# Patient Record
Sex: Female | Born: 1960 | Race: White | Hispanic: No | Marital: Married | Smoking: Never smoker
Health system: Southern US, Community
[De-identification: ages and names within clinical notes are randomized; demographics above are authoritative.]

## PROBLEM LIST (undated history)

## (undated) DIAGNOSIS — E039 Hypothyroidism, unspecified: Secondary | ICD-10-CM

## (undated) DIAGNOSIS — E079 Disorder of thyroid, unspecified: Secondary | ICD-10-CM

## (undated) DIAGNOSIS — I7300 Raynaud's syndrome without gangrene: Secondary | ICD-10-CM

## (undated) DIAGNOSIS — M329 Systemic lupus erythematosus, unspecified: Secondary | ICD-10-CM

## (undated) DIAGNOSIS — E119 Type 2 diabetes mellitus without complications: Secondary | ICD-10-CM

## (undated) DIAGNOSIS — M1990 Unspecified osteoarthritis, unspecified site: Secondary | ICD-10-CM

## (undated) DIAGNOSIS — M797 Fibromyalgia: Secondary | ICD-10-CM

## (undated) DIAGNOSIS — K769 Liver disease, unspecified: Secondary | ICD-10-CM

## (undated) DIAGNOSIS — M3500 Sicca syndrome, unspecified: Secondary | ICD-10-CM

## (undated) DIAGNOSIS — N3010 Interstitial cystitis (chronic) without hematuria: Secondary | ICD-10-CM

## (undated) DIAGNOSIS — M8700 Idiopathic aseptic necrosis of unspecified bone: Secondary | ICD-10-CM

## (undated) HISTORY — DX: Liver disease, unspecified: K76.9

## (undated) HISTORY — PX: NOSE SURGERY: SHX723

## (undated) HISTORY — PX: BREAST SURGERY: SHX581

## (undated) HISTORY — DX: Interstitial cystitis (chronic) without hematuria: N30.10

## (undated) HISTORY — DX: Raynaud's syndrome without gangrene: I73.00

## (undated) HISTORY — DX: Idiopathic aseptic necrosis of unspecified bone: M87.00

## (undated) HISTORY — PX: BILATERAL OOPHORECTOMY: SHX1221

## (undated) HISTORY — DX: Unspecified osteoarthritis, unspecified site: M19.90

## (undated) HISTORY — PX: ABDOMINAL HYSTERECTOMY: SHX81

## (undated) HISTORY — PX: OTHER SURGICAL HISTORY: SHX169

## (undated) HISTORY — DX: Hypothyroidism, unspecified: E03.9

## (undated) HISTORY — DX: Type 2 diabetes mellitus without complications: E11.9

## (undated) HISTORY — DX: Sjogren syndrome, unspecified: M35.00

---
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
## 1993-06-19 DIAGNOSIS — I7300 Raynaud's syndrome without gangrene: Secondary | ICD-10-CM | POA: Insufficient documentation

## 1993-06-19 HISTORY — DX: Raynaud's syndrome without gangrene: I73.00

## 1999-06-20 DIAGNOSIS — N3010 Interstitial cystitis (chronic) without hematuria: Secondary | ICD-10-CM | POA: Insufficient documentation

## 2002-04-10 ENCOUNTER — Encounter: Payer: Self-pay | Admitting: Family Medicine

## 2002-04-10 ENCOUNTER — Encounter: Admission: RE | Admit: 2002-04-10 | Discharge: 2002-04-10 | Payer: Self-pay | Admitting: Family Medicine

## 2004-03-19 ENCOUNTER — Emergency Department (HOSPITAL_COMMUNITY): Admission: EM | Admit: 2004-03-19 | Discharge: 2004-03-19 | Payer: Self-pay | Admitting: Emergency Medicine

## 2010-06-19 DIAGNOSIS — F419 Anxiety disorder, unspecified: Secondary | ICD-10-CM | POA: Insufficient documentation

## 2010-06-19 HISTORY — DX: Anxiety disorder, unspecified: F41.9

## 2011-08-21 DIAGNOSIS — G8190 Hemiplegia, unspecified affecting unspecified side: Secondary | ICD-10-CM

## 2011-08-21 DIAGNOSIS — J45909 Unspecified asthma, uncomplicated: Secondary | ICD-10-CM | POA: Insufficient documentation

## 2011-08-21 HISTORY — DX: Hemiplegia, unspecified affecting unspecified side: G81.90

## 2011-08-21 HISTORY — DX: Unspecified asthma, uncomplicated: J45.909

## 2012-11-23 DIAGNOSIS — G4700 Insomnia, unspecified: Secondary | ICD-10-CM

## 2012-11-23 DIAGNOSIS — M329 Systemic lupus erythematosus, unspecified: Secondary | ICD-10-CM | POA: Insufficient documentation

## 2012-11-23 DIAGNOSIS — E1142 Type 2 diabetes mellitus with diabetic polyneuropathy: Secondary | ICD-10-CM

## 2012-11-23 DIAGNOSIS — E039 Hypothyroidism, unspecified: Secondary | ICD-10-CM | POA: Insufficient documentation

## 2012-11-23 DIAGNOSIS — M2170 Unequal limb length (acquired), unspecified site: Secondary | ICD-10-CM | POA: Insufficient documentation

## 2012-11-23 DIAGNOSIS — Z794 Long term (current) use of insulin: Secondary | ICD-10-CM | POA: Insufficient documentation

## 2012-11-23 DIAGNOSIS — K769 Liver disease, unspecified: Secondary | ICD-10-CM

## 2012-11-23 HISTORY — DX: Systemic lupus erythematosus, unspecified: M32.9

## 2012-11-23 HISTORY — DX: Type 2 diabetes mellitus with diabetic polyneuropathy: E11.42

## 2012-11-23 HISTORY — DX: Hypothyroidism, unspecified: E03.9

## 2012-11-23 HISTORY — DX: Unequal limb length (acquired), unspecified site: M21.70

## 2012-11-23 HISTORY — DX: Liver disease, unspecified: K76.9

## 2012-11-23 HISTORY — DX: Insomnia, unspecified: G47.00

## 2013-03-19 DIAGNOSIS — M542 Cervicalgia: Secondary | ICD-10-CM

## 2013-03-19 HISTORY — DX: Cervicalgia: M54.2

## 2013-08-11 DIAGNOSIS — K5900 Constipation, unspecified: Secondary | ICD-10-CM

## 2013-08-11 HISTORY — DX: Constipation, unspecified: K59.00

## 2015-04-13 ENCOUNTER — Encounter (HOSPITAL_COMMUNITY): Payer: Self-pay | Admitting: *Deleted

## 2015-04-13 ENCOUNTER — Emergency Department (HOSPITAL_COMMUNITY): Payer: Medicare Other

## 2015-04-13 ENCOUNTER — Emergency Department (HOSPITAL_COMMUNITY)
Admission: EM | Admit: 2015-04-13 | Discharge: 2015-04-14 | Disposition: A | Discharge status: Home / Self Care | Payer: Medicare Other | Attending: Emergency Medicine | Admitting: Emergency Medicine

## 2015-04-13 DIAGNOSIS — Y9289 Other specified places as the place of occurrence of the external cause: Secondary | ICD-10-CM | POA: Insufficient documentation

## 2015-04-13 DIAGNOSIS — Z79899 Other long term (current) drug therapy: Secondary | ICD-10-CM | POA: Insufficient documentation

## 2015-04-13 DIAGNOSIS — W06XXXA Fall from bed, initial encounter: Secondary | ICD-10-CM | POA: Diagnosis not present

## 2015-04-13 DIAGNOSIS — Y9389 Activity, other specified: Secondary | ICD-10-CM | POA: Diagnosis not present

## 2015-04-13 DIAGNOSIS — R451 Restlessness and agitation: Secondary | ICD-10-CM | POA: Insufficient documentation

## 2015-04-13 DIAGNOSIS — Z880 Allergy status to penicillin: Secondary | ICD-10-CM | POA: Diagnosis not present

## 2015-04-13 DIAGNOSIS — S060X9A Concussion with loss of consciousness of unspecified duration, initial encounter: Secondary | ICD-10-CM | POA: Insufficient documentation

## 2015-04-13 DIAGNOSIS — Y998 Other external cause status: Secondary | ICD-10-CM | POA: Diagnosis not present

## 2015-04-13 DIAGNOSIS — Z8739 Personal history of other diseases of the musculoskeletal system and connective tissue: Secondary | ICD-10-CM | POA: Diagnosis not present

## 2015-04-13 DIAGNOSIS — F419 Anxiety disorder, unspecified: Secondary | ICD-10-CM | POA: Diagnosis present

## 2015-04-13 DIAGNOSIS — F410 Panic disorder [episodic paroxysmal anxiety] without agoraphobia: Secondary | ICD-10-CM | POA: Diagnosis not present

## 2015-04-13 DIAGNOSIS — E079 Disorder of thyroid, unspecified: Secondary | ICD-10-CM | POA: Insufficient documentation

## 2015-04-13 HISTORY — DX: Disorder of thyroid, unspecified: E07.9

## 2015-04-13 HISTORY — DX: Fibromyalgia: M79.7

## 2015-04-13 HISTORY — DX: Systemic lupus erythematosus, unspecified: M32.9

## 2015-04-13 MED ORDER — LORAZEPAM 1 MG PO TABS: 1.0000 mg | ORAL_TABLET | Freq: Three times a day (TID) | ORAL | Status: DC | PRN

## 2015-04-13 MED ORDER — LORAZEPAM 1 MG PO TABS
1.0000 mg | ORAL_TABLET | Freq: Once | ORAL | Status: AC
  Administered 2015-04-13: 1 mg via ORAL
  Filled 2015-04-13: qty 1

## 2015-04-13 MED ORDER — HYDROCODONE-ACETAMINOPHEN 5-325 MG PO TABS
1.0000 | ORAL_TABLET | Freq: Once | ORAL | Status: AC
Start: 2015-04-14 — End: 2015-04-13
  Administered 2015-04-13: 1 via ORAL
  Filled 2015-04-13: qty 1
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
## 2015-04-13 NOTE — ED Notes (Signed)
Patient sitting on stretcher, respirations even and unlabored, skin warm and dry, in NAD, denies further needs, will continue to monitor.
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
## 2015-04-13 NOTE — Discharge Instructions (Signed)
Concussion, Adult A concussion, or closed-head injury, is a brain injury caused by a direct blow to the head or by a quick and sudden movement (jolt) of the head or neck. Concussions are usually not life-threatening. Even so, the effects of a concussion can be serious. If you have had a concussion before, you are more likely to experience concussion-like symptoms after a direct blow to the head.  CAUSES  Direct blow to the head, such as from running into another player during a soccer game, being hit in a fight, or hitting your head on a hard surface.  A jolt of the head or neck that causes the brain to move back and forth inside the skull, such as in a car crash. SIGNS AND SYMPTOMS The signs of a concussion can be hard to notice. Early on, they may be missed by you, family members, and health care providers. You may look fine but act or feel differently. Symptoms are usually temporary, but they may last for days, weeks, or even longer. Some symptoms may appear right away while others may not show up for hours or days. Every head injury is different. Symptoms include:  Mild to moderate headaches that will not go away.  A feeling of pressure inside your head.  Having more trouble than usual:  Learning or remembering things you have heard.  Answering questions.  Paying attention or concentrating.  Organizing daily tasks.  Making decisions and solving problems.  Slowness in thinking, acting or reacting, speaking, or reading.  Getting lost or being easily confused.  Feeling tired all the time or lacking energy (fatigued).  Feeling drowsy.  Sleep disturbances.  Sleeping more than usual.  Sleeping less than usual.  Trouble falling asleep.  Trouble sleeping (insomnia).  Loss of balance or feeling lightheaded or dizzy.  Nausea or vomiting.  Numbness or tingling.  Increased sensitivity to:  Sounds.  Lights.  Distractions.  Vision problems or eyes that tire  easily.  Diminished sense of taste or smell.  Ringing in the ears.  Mood changes such as feeling sad or anxious.  Becoming easily irritated or angry for little or no reason.  Lack of motivation.  Seeing or hearing things other people do not see or hear (hallucinations). DIAGNOSIS Your health care provider can usually diagnose a concussion based on a description of your injury and symptoms. He or she will ask whether you passed out (lost consciousness) and whether you are having trouble remembering events that happened right before and during your injury. Your evaluation might include:  A brain scan to look for signs of injury to the brain. Even if the test shows no injury, you may still have a concussion.  Blood tests to be sure other problems are not present. TREATMENT  Concussions are usually treated in an emergency department, in urgent care, or at a clinic. You may need to stay in the hospital overnight for further treatment.  Tell your health care provider if you are taking any medicines, including prescription medicines, over-the-counter medicines, and natural remedies. Some medicines, such as blood thinners (anticoagulants) and aspirin, may increase the chance of complications. Also tell your health care provider whether you have had alcohol or are taking illegal drugs. This information may affect treatment.  Your health care provider will send you home with important instructions to follow.  How fast you will recover from a concussion depends on many factors. These factors include how severe your concussion is, what part of your brain was injured,  your age, and how healthy you were before the concussion.  Most people with mild injuries recover fully. Recovery can take time. In general, recovery is slower in older persons. Also, persons who have had a concussion in the past or have other medical problems may find that it takes longer to recover from their current injury. HOME  CARE INSTRUCTIONS General Instructions  Carefully follow the directions your health care provider gave you.  Only take over-the-counter or prescription medicines for pain, discomfort, or fever as directed by your health care provider.  Take only those medicines that your health care provider has approved.  Do not drink alcohol until your health care provider says you are well enough to do so. Alcohol and certain other drugs may slow your recovery and can put you at risk of further injury.  If it is harder than usual to remember things, write them down.  If you are easily distracted, try to do one thing at a time. For example, do not try to watch TV while fixing dinner.  Talk with family members or close friends when making important decisions.  Keep all follow-up appointments. Repeated evaluation of your symptoms is recommended for your recovery.  Watch your symptoms and tell others to do the same. Complications sometimes occur after a concussion. Older adults with a brain injury may have a higher risk of serious complications, such as a blood clot on the brain.  Tell your teachers, school nurse, school counselor, coach, athletic trainer, or work Freight forwarder about your injury, symptoms, and restrictions. Tell them about what you can or cannot do. They should watch for:  Increased problems with attention or concentration.  Increased difficulty remembering or learning new information.  Increased time needed to complete tasks or assignments.  Increased irritability or decreased ability to cope with stress.  Increased symptoms.  Rest. Rest helps the brain to heal. Make sure you:  Get plenty of sleep at night. Avoid staying up late at night.  Keep the same bedtime hours on weekends and weekdays.  Rest during the day. Take daytime naps or rest breaks when you feel tired.  Limit activities that require a lot of thought or concentration. These include:  Doing homework or job-related  work.  Watching TV.  Working on the computer.  Avoid any situation where there is potential for another head injury (football, hockey, soccer, basketball, martial arts, downhill snow sports and horseback riding). Your condition will get worse every time you experience a concussion. You should avoid these activities until you are evaluated by the appropriate follow-up health care providers. Returning To Your Regular Activities You will need to return to your normal activities slowly, not all at once. You must give your body and brain enough time for recovery.  Do not return to sports or other athletic activities until your health care provider tells you it is safe to do so.  Ask your health care provider when you can drive, ride a bicycle, or operate heavy machinery. Your ability to react may be slower after a brain injury. Never do these activities if you are dizzy.  Ask your health care provider about when you can return to work or school. Preventing Another Concussion It is very important to avoid another brain injury, especially before you have recovered. In rare cases, another injury can lead to permanent brain damage, brain swelling, or death. The risk of this is greatest during the first 7-10 days after a head injury. Avoid injuries by:  Wearing a  seat belt when riding in a car.  Drinking alcohol only in moderation.  Wearing a helmet when biking, skiing, skateboarding, skating, or doing similar activities.  Avoiding activities that could lead to a second concussion, such as contact or recreational sports, until your health care provider says it is okay.  Taking safety measures in your home.  Remove clutter and tripping hazards from floors and stairways.  Use grab bars in bathrooms and handrails by stairs.  Place non-slip mats on floors and in bathtubs.  Improve lighting in dim areas. SEEK MEDICAL CARE IF:  You have increased problems paying attention or  concentrating.  You have increased difficulty remembering or learning new information.  You need more time to complete tasks or assignments than before.  You have increased irritability or decreased ability to cope with stress.  You have more symptoms than before. Seek medical care if you have any of the following symptoms for more than 2 weeks after your injury:  Lasting (chronic) headaches.  Dizziness or balance problems.  Nausea.  Vision problems.  Increased sensitivity to noise or light.  Depression or mood swings.  Anxiety or irritability.  Memory problems.  Difficulty concentrating or paying attention.  Sleep problems.  Feeling tired all the time. SEEK IMMEDIATE MEDICAL CARE IF:  You have severe or worsening headaches. These may be a sign of a blood clot in the brain.  You have weakness (even if only in one hand, leg, or part of the face).  You have numbness.  You have decreased coordination.  You vomit repeatedly.  You have increased sleepiness.  One pupil is larger than the other.  You have convulsions.  You have slurred speech.  You have increased confusion. This may be a sign of a blood clot in the brain.  You have increased restlessness, agitation, or irritability.  You are unable to recognize people or places.  You have neck pain.  It is difficult to wake you up.  You have unusual behavior changes.  You lose consciousness. MAKE SURE YOU:  Understand these instructions.  Will watch your condition.  Will get help right away if you are not doing well or get worse.   This information is not intended to replace advice given to you by your health care provider. Make sure you discuss any questions you have with your health care provider.   Document Released: 08/26/2003 Document Revised: 06/26/2014 Document Reviewed: 12/26/2012 Elsevier Interactive Patient Education 2016 Elsevier Inc.  Panic Attacks Panic attacks are sudden,  short-livedsurges of severe anxiety, fear, or discomfort. They may occur for no reason when you are relaxed, when you are anxious, or when you are sleeping. Panic attacks may occur for a number of reasons:   Healthy people occasionally have panic attacks in extreme, life-threatening situations, such as war or natural disasters. Normal anxiety is a protective mechanism of the body that helps [place] react to danger (fight or flight response).  Panic attacks are often seen with anxiety disorders, such as panic disorder, social anxiety disorder, generalized anxiety disorder, and phobias. Anxiety disorders cause excessive or uncontrollable anxiety. They may interfere with your relationships or other life activities.  Panic attacks are sometimes seen with other mental illnesses, such as depression and posttraumatic stress disorder.  Certain medical conditions, prescription medicines, and drugs of abuse can cause panic attacks. SYMPTOMS  Panic attacks start suddenly, peak within 20 minutes, and are accompanied by four or more of the following symptoms:  Pounding heart or fast heart rate (  palpitations).  Sweating.  Trembling or shaking.  Shortness of breath or feeling smothered.  Feeling choked.  Chest pain or discomfort.  Nausea or strange feeling in your stomach.  Dizziness, light-headedness, or feeling like you will faint.  Chills or hot flushes.  Numbness or tingling in your lips or hands and feet.  Feeling that things are not real or feeling that you are not yourself.  Fear of losing control or going crazy.  Fear of dying. Some of these symptoms can mimic serious medical conditions. For example, you may think you are having a heart attack. Although panic attacks can be very scary, they are not life threatening. DIAGNOSIS  Panic attacks are diagnosed through an assessment by your health care provider. Your health care provider will ask questions about your symptoms, such as where and  when they occurred. Your health care provider will also ask about your medical history and use of alcohol and drugs, including prescription medicines. Your health care provider may order blood tests or other studies to rule out a serious medical condition. Your health care provider may refer you to a mental health professional for further evaluation. TREATMENT   Most healthy people who have one or two panic attacks in an extreme, life-threatening situation will not require treatment.  The treatment for panic attacks associated with anxiety disorders or other mental illness typically involves counseling with a mental health professional, medicine, or a combination of both. Your health care provider will help determine what treatment is best for you.  Panic attacks due to physical illness usually go away with treatment of the illness. If prescription medicine is causing panic attacks, talk with your health care provider about stopping the medicine, decreasing the dose, or substituting another medicine.  Panic attacks due to alcohol or drug abuse go away with abstinence. Some adults need professional help in order to stop drinking or using drugs. HOME CARE INSTRUCTIONS   Take all medicines as directed by your health care provider.   Schedule and attend follow-up visits as directed by your health care provider. It is important to keep all your appointments. SEEK MEDICAL CARE IF:  You are not able to take your medicines as prescribed.  Your symptoms do not improve or get worse. SEEK IMMEDIATE MEDICAL CARE IF:   You experience panic attack symptoms that are different than your usual symptoms.  You have serious thoughts about hurting yourself or others.  You are taking medicine for panic attacks and have a serious side effect. MAKE SURE YOU:  Understand these instructions.  Will watch your condition.  Will get help right away if you are not doing well or get worse.   This information is  not intended to replace advice given to you by your health care provider. Make sure you discuss any questions you have with your health care provider.   Document Released: 06/05/2005 Document Revised: 06/10/2013 Document Reviewed: 01/17/2013 Elsevier Interactive Patient Education 2016 [employer].   Emergency Department Resource Guide 1) Find a Doctor and Pay Out of Pocket Although you won't have to find out who is covered by your insurance plan, it is a good idea to ask around and get recommendations. You will then need to call the office and see if the doctor you have chosen will accept you as a new patient and what types of options they offer for patients who are self-pay. Some doctors offer discounts or will set up payment plans for their patients who do not have insurance,  but you will need to ask so you aren't surprised when you get to your appointment.  2) Contact Your Local Health Department Not all health departments have doctors that can see patients for sick visits, but many do, so it is worth a call to see if yours does. If you don't know where your local health department is, you can check in your phone book. The CDC also has a tool to help you locate your state's health department, and many state websites also have listings of all of their local health departments.  3) Find a Walk-in Clinic If your illness is not likely to be very severe or complicated, you may want to try a walk in clinic. These are popping up all over the country in pharmacies, drugstores, and shopping centers. They're usually staffed by nurse practitioners or physician assistants that have been trained to treat common illnesses and complaints. They're usually fairly quick and inexpensive. However, if you have serious medical issues or chronic medical problems, these are probably not your best option.  No Primary Care Doctor: - Call Health Connect at  [phone] - they can help you locate a primary care doctor that   accepts your insurance, provides certain services, etc. - Physician Referral Service- [phone]  Chronic Pain Problems: Organization         Address  Phone   Notes  [facility]  [phone] Patients need to be referred by their primary care doctor.   Medication Assistance: Organization         Address  Phone   Notes  [facility] Medication [facility] [address] [place] [place], [place] [address] [phone] --Must be a resident of [facility] -- Must have NO insurance coverage whatsoever (no Medicaid/ Medicare, etc.) -- The pt. MUST have a primary care doctor that directs their care regularly and follows them in the community   [facility]  [phone]   [employer]  [phone]    Agencies that provide inexpensive medical care: Organization         Address  Phone   Notes  [facility]  [phone]   [facility]    [phone]   [facility] [address] [place], [place] [address] [phone]   [facility] [address], [place] [phone]   [facility]    [phone]   [facility]    [phone]   [facility]  [address], [place] Phone:  [phone], Fax:  [phone] Hours of Operation:  9 am - 6 pm, M-F.  Also accepts Medicaid/Medicare and self-pay.  [facility]  [address], [place] Phone: [phone], Fax: [phone]. Hours of Operation:  8:30 am - 5:30 pm, M-F.  Also accepts Medicaid and self-pay.  [facility] [place] [address], [place] Phone: [phone]   [facility] [address] Nat[address]ence [place], [place] [phone], Ext. [phone] Mondays & Thursdays: 7-9 AM.  First 15 patients are seen on a first come, first serve basis.    Medicaid-accepting [facility] Providers:  Organization          Address  Phone   Notes  [facility] [address], [place] [phone] Also accepts self-pay patients.  [facility] [address] [place],  [address] [place]  [phone]   [facility] [address], [place] [phone]   [facility] [address], [place] [phone]   Renaye Rakers [address], [place]   [phone] Only accepts [place] patients after they have their name applied to their card.   Self-Pay (no insurance) in [facility]:  Organization         Address  Phone   Notes  Sickle Cell Patients, [facility] [address] [place], [place] [phone]   [facility] Urgent Care [address] [place], [place] [phone]   [facility] [place]  [address], [place] [phone]   [facility]/Dr. Osei-Bonsu  [address], [place] or [address], [place] [phone] Phone number for both [place] and [place] locations is the same.  [facility] [address] [place] [phone]   [facility] [address], [place] or [address] [phone]   [facility] [address], [place] [phone], phone; [phone], fax Sees patients 1st and 3rd Saturday of every month.  Must not qualify for public or private insurance (i.e. Medicaid, Medicare, [place] Health Choice, Veterans' Benefits)  Household income should be no more than 200% of the poverty level The clinic cannot treat you if you are pregnant or think you are pregnant  Sexually transmitted diseases are not treated at the clinic.    Dental Care: Organization         Address  Phone  Notes  [facility] Department of [facility] [address] [place],  [place] [phone] Accepts children up to age 23 who are enrolled in [place] or [place] Health Choice; pregnant women with a Medicaid card; and children who have applied for Medicaid or [place] Health Choice, but were declined, whose parents can pay a reduced fee at time of service.  [facility] Department of [facility]  [address], [place] [phone] Accepts children up to age 2 who are enrolled in [place] or [place] Health Choice; pregnant women with a Medicaid card; and children who have applied for Medicaid or [place] Health Choice, but were declined, whose parents can pay a reduced fee at time of service.  [facility]  [address] [place], [place] [phone] Patients are seen by appointment only. Walk-ins are not accepted. [facility] will see patients 39 years of age and older. Monday - Tuesday (8am-5pm) Most Wednesdays (8:30-5pm) $30 per visit, cash only  [facility]  [address], [facility] [phone] Patients are seen by appointment only. Walk-ins are not accepted. [facility] will see patients 58 years of age and older. One Wednesday Evening (Monthly: Volunteer Based).  $30 per visit, cash only  [employer]  [phone] for adults; Children under age 27, call Graduate Pediatric Dentistry at [phone]. Children aged 61-14, please call [phone] to request a pediatric application.  Dental services are provided in all areas of dental care including fillings, crowns and bridges, complete and partial dentures, implants, gum treatment, root canals, and extractions. Preventive care is also provided. Treatment is provided to both adults and children. Patients are selected via a lottery  and there is often a waiting list.   [facility] [address], [place]  [phone] [email or web address]   [facility] [address], [place], [place]  [phone], Ext. [phone] Second and Fourth Thursday of each month, opens at 6:30 AM; Clinic ends at 9 AM.  Patients are seen on a first-come first-served basis, and a limited number are seen during each clinic.   [facility]  [address], [place], [place] [phone]   Eligibility Requirements You must have lived in [place], [place], or [place] counties for at least the last three months.   You cannot be eligible for state or federal sponsored [place]healthcare insurance, including CIGNAVeterans Administration, [place], or [employer]icare.   You generally cannot be eligible for healthcare insurance through your employer.    How to apply: Eligibility screenings are held every Tuesday and Wednesday afternoon from 1:00 pm until 4:00 pm. You do not need an appointment for the interview!  [facility] [address], [place], [place] [phone]   [facility]  [phone]   [facility]  [phone]   [facility]  [phone]    Behavioral Health Resources in the Community: Intensive Outpatient Programs Organization         Address  Phone  Notes  [facility] [address], [place], [place] [phone]   [facility] [address], [place], [place] [phone]   [facility] [address], [place], [place]  [phone]   [facility] [address],  [place], [place] [phone] or [phone]   Substance Abuse Resources Organization         Address  Phone  Notes  [facility]  [phone]   [facility]  [phone]   [facility]  [phone]   [facility]  [phone]   Residential & Outpatient Substance Abuse Program  [phone]   Psychological Services Organization         Address  Phone  Notes  [facility]  [phone]   [facility]  [phone]- [phone]    [facility] [address], [place] [phone] or [phone]    Mobile Crisis Teams Organization         Address  Phone  Notes  [facility], Mobile Crisis Care Unit  [phone]   [facility]  [address] [place], [place] [phone]   Doristine LocksSharon DeEsch [address] [place] [phone]    Self-Help/Support Groups Organization         Address  Phone             Notes  [facility] [place] - variety of support groups  [phone]- [phone] Call for more information  Narcotics Anonymous (NA), [facility] [address], [place]  2 meetings at this location   [facility] Programs Organization         Address  Phone  Notes  [facility] [address],    [place]  [phone]   [facility]  [address], [place] [phone], [place], [place] [phone]   [facility] [address], [place] [phone] Admissions: 8am-3pm M-F  [facility] [address]    [place], [place] [phone]   [facility] [address], [place],  [place] [phone]   [facility] [address]  [place], [place] [phone]   [facility] - Intensive Outpatient [address], [place], [place] [phone]   [facility] ([facility] [address]  [place], [place] [phone] or [phone]   [facility] ([facility]) [address] [place], [place] [phone] Accepts Medicaid  [facility] [address]  [place] [phone] Substance Abuse/Addiction Treatment   [facility] Resources Organization         Address  Phone  Notes  [facility]  [phone]   Angie FavaJulie Brannon, PhD [address], [address] [place], [place]   [phone] or [phone]) [phone]   [facility]   [address] [place], [place] [phone]   [facility] [address], [place], [place] [phone]) [phone] Insurance/Medicaid/sponsorship through [facility]  [facility] [address]                                    [place], [place] [phone]) [phone] Therapy/tele-psych/case  [facility] [address]   [place], [place] [phone]    Dr. Lolly MustacheArfeen  [phone](336) [phone]   [facility]  [employer] [facility] 1) [address], [place] 2) [address], [place] 3)  [address], [place] [phone]) [phone]  [phone]) [phone]   [facility] Child Abuse Hotline [phone]) [phone] or [phone](336) [phone] (After Hours)

## 2015-04-14 DIAGNOSIS — F410 Panic disorder [episodic paroxysmal anxiety] without agoraphobia: Secondary | ICD-10-CM | POA: Diagnosis not present

## 2015-05-19 ENCOUNTER — Ambulatory Visit: Payer: Medicaid Other | Admitting: Internal Medicine

## 2015-05-19 DIAGNOSIS — Z0289 Encounter for other administrative examinations: Secondary | ICD-10-CM

## 2015-05-28 ENCOUNTER — Emergency Department (HOSPITAL_COMMUNITY)
Admission: EM | Admit: 2015-05-28 | Discharge: 2015-05-28 | Disposition: A | Discharge status: Home / Self Care | Payer: Medicare Other | Attending: Emergency Medicine | Admitting: Emergency Medicine

## 2015-05-28 ENCOUNTER — Encounter (HOSPITAL_COMMUNITY): Payer: Self-pay | Admitting: Emergency Medicine

## 2015-05-28 DIAGNOSIS — Z79899 Other long term (current) drug therapy: Secondary | ICD-10-CM | POA: Insufficient documentation

## 2015-05-28 DIAGNOSIS — M545 Low back pain: Secondary | ICD-10-CM | POA: Diagnosis present

## 2015-05-28 DIAGNOSIS — Z880 Allergy status to penicillin: Secondary | ICD-10-CM | POA: Diagnosis not present

## 2015-05-28 DIAGNOSIS — M797 Fibromyalgia: Secondary | ICD-10-CM | POA: Insufficient documentation

## 2015-05-28 DIAGNOSIS — E079 Disorder of thyroid, unspecified: Secondary | ICD-10-CM | POA: Diagnosis not present

## 2015-05-28 DIAGNOSIS — G8929 Other chronic pain: Secondary | ICD-10-CM | POA: Insufficient documentation

## 2015-05-28 DIAGNOSIS — M6281 Muscle weakness (generalized): Secondary | ICD-10-CM | POA: Diagnosis not present

## 2015-05-28 DIAGNOSIS — M549 Dorsalgia, unspecified: Secondary | ICD-10-CM | POA: Insufficient documentation

## 2015-05-28 LAB — URINE MICROSCOPIC-ADD ON

## 2015-05-28 LAB — URINALYSIS, ROUTINE W REFLEX MICROSCOPIC
BILIRUBIN URINE: NEGATIVE
Glucose, UA: >1000 mg/dL — AB
HGB URINE DIPSTICK: NEGATIVE
Ketones, ur: NEGATIVE mg/dL
NITRITE: NEGATIVE
PH: 5.5 (ref 5.0–8.0)
Protein, ur: NEGATIVE mg/dL
SPECIFIC GRAVITY, URINE: 1.015 (ref 1.005–1.030)

## 2015-05-28 LAB — CBG MONITORING, ED: GLUCOSE-CAPILLARY: 107 mg/dL — AB (ref 65–99)

## 2015-05-28 MED ORDER — HYDROCODONE-ACETAMINOPHEN 5-325 MG PO TABS
2.0000 | ORAL_TABLET | Freq: Once | ORAL | Status: AC
  Administered 2015-05-28: 2 via ORAL
  Filled 2015-05-28: qty 2

## 2015-05-28 MED ORDER — METHOCARBAMOL 500 MG PO TABS: 500.0000 mg | ORAL_TABLET | Freq: Two times a day (BID) | ORAL | Status: DC

## 2015-05-28 MED ORDER — METHOCARBAMOL 500 MG PO TABS
500.0000 mg | ORAL_TABLET | Freq: Once | ORAL | Status: AC
  Administered 2015-05-28: 500 mg via ORAL
  Filled 2015-05-28: qty 1

## 2015-05-28 NOTE — ED Notes (Signed)
Per patient: Pt states Tuesday and Wednesday she used fleets enema, having back pain. Went to PCP who consulted her pain clinic to try and find a better solution for her pain relief. Pt crying in triage. Pt states the back pain started Sunday while sitting on the couch, says the left side hurts and radiates into the right side. States she vomited this morning from the pain.

## 2015-05-28 NOTE — ED Provider Notes (Signed)
CSN: [id number]     Arrival date & time 05/28/15  1542 History   First MD Initiated Contact with Patient 05/28/15 1800     Chief Complaint  Patient presents with  . Flank Pain  . Back Pain    HPI   Jamie Holmes is a 54 y.o. female with a PMH of SLE, fibromyalgia, chronic pain, thyroid disease who presents to the ED with bilateral lower back pain, which she states started Sunday and has been constant since that time. She reports radiation of her back pain to her left lower extremity. She reports chronic right sided weakness, which she states is unchanged from prior. She denies exacerbating factors. She has tried her home pain medication for symptom relief, which has been minimally effective. She denies fever, chills, abdominal pain, N/V/D/C, dysuria, urgency, frequency, hematuria. She denies numbness, paresthesia, saddle anesthesia, bowel or bladder incontinence, history of malignancy, IVDU, anticoagulant use.    Past Medical History  Diagnosis Date  . Lupus (systemic lupus erythematosus) (HCC)   . Fibromyalgia   . Thyroid disease    Past Surgical History  Procedure Laterality Date  . Abdominal hysterectomy    . Breast surgery     History reviewed. No pertinent family history. Social History  Substance Use Topics  . Smoking status: Never Smoker   . Smokeless tobacco: None  . Alcohol Use: No   OB History    No data available      Review of Systems  Constitutional: Negative for fever and chills.  Gastrointestinal: Negative for nausea, vomiting, abdominal pain, diarrhea, constipation and blood in stool.  Genitourinary: Negative for dysuria, urgency, frequency and hematuria.  Musculoskeletal: Positive for back pain.  Neurological: Positive for weakness. Negative for dizziness, light-headedness, numbness and headaches.  All other systems reviewed and are negative.     Allergies  Ivp dye; Other; Penicillins; and Prednisone  Home Medications   Prior to Admission  medications   Medication Sig Start Date End Date Taking? Authorizing Provider  buPROPion (WELLBUTRIN XL) 150 MG 24 hr tablet Take 150 mg by mouth daily. 05/07/15 05/06/16 Yes Historical Provider, MD  canagliflozin (INVOKANA) 100 MG TABS tablet Take 100 mg by mouth daily.   Yes Historical Provider, MD  gabapentin (NEURONTIN) 300 MG capsule Take 300 mg by mouth 3 (three) times daily. 05/26/15  Yes Historical Provider, MD  HUMULIN N KWIKPEN 100 UNIT/ML Kiwkpen Inject 20 Units into the skin 2 (two) times daily. 05/21/15  Yes Historical Provider, MD  HYDROcodone-acetaminophen (NORCO/VICODIN) 5-325 MG tablet Take 1 tablet by mouth every 6 (six) hours as needed for moderate pain.   Yes Historical Provider, MD  levothyroxine (SYNTHROID, LEVOTHROID) 150 MCG tablet Take 150 mcg by mouth daily before breakfast.   Yes Historical Provider, MD  methadone (DOLOPHINE) 10 MG tablet Take 20-40 mg by mouth 2 (two) times daily. Take  in the morning, and then 30 mg at night   Yes Historical Provider, MD  Multiple Vitamin (MULTIVITAMIN WITH MINERALS) TABS tablet Take 1 tablet by mouth daily.   Yes Historical Provider, MD  nystatin-triamcinolone (MYCOLOG II) cream Apply 1 application topically 4 (four) times daily as needed. irritation 05/26/15  Yes Historical Provider, MD  pantoprazole (PROTONIX) 40 MG tablet Take 40 mg by mouth daily.   Yes Historical Provider, MD  polyethylene glycol powder (GLYCOLAX/MIRALAX) powder Take 17 g by mouth daily as needed. constipation 05/26/15  Yes Historical Provider, MD  traZODone (DESYREL) 100 MG tablet Take 300 mg by mouth at  bedtime.   Yes Historical Provider, MD  VOLTAREN 1 % GEL Apply 1 application topically 2 (two) times daily as needed. pain 05/26/15  Yes Historical Provider, MD  LORazepam (ATIVAN) 1 MG tablet Take 1 tablet (1 mg total) by mouth 3 (three) times daily as needed for anxiety. Patient not taking: Reported on 05/28/2015 04/13/15   Gilda Crease, MD  methocarbamol  (ROBAXIN) 500 MG tablet Take 1 tablet (500 mg total) by mouth 2 (two) times daily. 05/28/15   Dorise Hiss Norwin Aleman, PA-C    BP 104/75 mmHg  Pulse 78  Temp(Src) 98.7 F (37.1 C) (Oral)  Resp 16  SpO2 98% Physical Exam  Constitutional: She is oriented to person, place, and time. She appears well-developed and well-nourished. No distress.  Patient appears uncomfortable due to pain.  HENT:  Head: Normocephalic and atraumatic.  Right Ear: External ear normal.  Left Ear: External ear normal.  Nose: Nose normal.  Mouth/Throat: Uvula is midline, oropharynx is clear and moist and mucous membranes are normal.  Eyes: Conjunctivae, EOM and lids are normal. Pupils are equal, round, and reactive to light. Right eye exhibits no discharge. Left eye exhibits no discharge. No scleral icterus.  Neck: Normal range of motion. Neck supple.  Cardiovascular: Normal rate, regular rhythm, normal heart sounds, intact distal pulses and normal pulses.   Pulmonary/Chest: Effort normal and breath sounds normal. No respiratory distress. She has no wheezes. She has no rales.  Abdominal: Soft. Normal appearance and bowel sounds are normal. She exhibits no distension and no mass. There is no tenderness. There is no rigidity, no rebound, no guarding and no CVA tenderness.  Musculoskeletal: Normal range of motion. She exhibits no edema or tenderness.  No TTP of lumbar spine or paraspinal muscles. No step-off or deformity.  Neurological: She is alert and oriented to person, place, and time. She has normal strength and normal reflexes. No cranial nerve deficit or sensory deficit.  Patient ambulates without difficulty.  Skin: Skin is warm, dry and intact. No rash noted. She is not diaphoretic. No erythema. No pallor.  Psychiatric: She has a normal mood and affect. Her speech is normal and behavior is normal.  Nursing note and vitals reviewed.   ED Course  Procedures (including critical care time)  Labs Review Labs  Reviewed  URINALYSIS, ROUTINE W REFLEX MICROSCOPIC (NOT AT [facility]) - Abnormal; Notable for the following:    Glucose, UA >1000 (*)    Leukocytes, UA TRACE (*)    All other components within normal limits  URINE MICROSCOPIC-ADD ON - Abnormal; Notable for the following:    Squamous Epithelial / LPF 0-5 (*)    Bacteria, UA RARE (*)    All other components within normal limits  CBG MONITORING, ED - Abnormal; Notable for the following:    Glucose-Capillary 107 (*)    All other components within normal limits    Imaging Review No results found.   I have personally reviewed and evaluated these lab results as part of my medical decision-making.   EKG Interpretation None      MDM   Final diagnoses:  Back pain, unspecified location    54 year old female presents with back pain, and states her pain radiates to her left lower extremity. She reports chronic right-sided weakness, unchanged from baseline. She denies fever, chills, abdominal pain, N/V/D/C, dysuria, urgency, frequency, hematuria, numbness, paresthesia, saddle anesthesia, bowel or bladder incontinence, history of malignancy, IVDU, anticoagulant use.  Patient is afebrile. Vital signs stable. Heart RRR. Lungs  clear to auscultation bilaterally. Abdomen soft, non-tender, non-distended. Normal neuro exam with no focal deficit. Strength, sensation, DTRs intact. Patient moves all extremities and ambulates without difficulty.  Patient given pain medication and robaxin with significant symptom improvement.  UA and urine microscopic with trace leukocytes, 6-30 WBC, rare bacteria. Patient denies dysuria, urgency, and frequency; do not feel treatment for UTI is indicated at this time.  Low suspicion for cauda equina, hematoma, abscess. Patient is non-toxic and well-appearing, feel she is stable for discharge at this time. Will treat with robaxin and advised patient to continue her normal home medications for chronic pain. Patient to follow-up  with PCP. Return precautions discussed at length. Patient verbalizes her understanding and is in agreement with plan.  BP 104/75 mmHg  Pulse 78  Temp(Src) 98.7 F (37.1 C) (Oral)  Resp 16  SpO2 98%     Mady Gemmalizabeth C Shaughnessy Gethers, PA-C 05/29/15 0034  Benjiman CoreNathan Pickering, MD 05/29/15 2340

## 2015-05-28 NOTE — ED Notes (Signed)
Pt came up to registration asking about wait, rn explained that the wait is long right now, but that we would get patient triaged as quickly as possible. Pt says that "she came in by ambulance", rn explained that most people that come in by ambulance come to triage and wait in the lobby. Pt stated that she may start screaming due to the pain. rn explained that screaming would not get the patient back faster. Pt asked if she could take an ambulance to another hospital. rn explained that the wait at the other local hospitals will possibly be longer.

## 2015-05-28 NOTE — ED Notes (Signed)
Explained to patient about the lack of rooms, given heat pack for low back to help ease pain. Pt thanked me.

## 2015-05-28 NOTE — ED Notes (Addendum)
Per EMS, bilateral back pain, radiating to front. Placed on gabapentin Tuesday from PCP, hx of Lupus. Took methadone, gabapentin, and vicodin around 1500 today, states her pain is still a 7

## 2015-05-28 NOTE — Discharge Instructions (Signed)
1. Medications: robaxin, usual home medications 2. Treatment: rest, drink plenty of fluids, back exercises 3. Follow Up: please followup with your primary doctor for discussion of your diagnoses and further evaluation after today's visit; if you do not have a primary care doctor use the resource guide provided to find one; please return to the ER for increased pain, numbness, loss of control of your bowel or bladder, new or worsening symptoms   Back Exercises If you have pain in your back, do these exercises 2-3 times each day or as told by your doctor. When the pain goes away, do the exercises once each day, but repeat the steps more times for each exercise (do more repetitions). If you do not have pain in your back, do these exercises once each day or as told by your doctor. EXERCISES Single Knee to Chest Do these steps 3-5 times in a row for each leg:  Lie on your back on a firm bed or the floor with your legs stretched out.  Bring one knee to your chest.  Hold your knee to your chest by grabbing your knee or thigh.  Pull on your knee until you feel a gentle stretch in your lower back.  Keep doing the stretch for 10-30 seconds.  Slowly let go of your leg and straighten it. Pelvic Tilt Do these steps 5-10 times in a row:  Lie on your back on a firm bed or the floor with your legs stretched out.  Bend your knees so they point up to the ceiling. Your feet should be flat on the floor.  Tighten your lower belly (abdomen) muscles to press your lower back against the floor. This will make your tailbone point up to the ceiling instead of pointing down to your feet or the floor.  Stay in this position for 5-10 seconds while you gently tighten your muscles and breathe evenly. Cat-Cow Do these steps until your lower back bends more easily:  Get on your hands and knees on a firm surface. Keep your hands under your shoulders, and keep your knees under your hips. You may put padding under  your knees.  Let your head hang down, and make your tailbone point down to the floor so your lower back is round like the back of a cat.  Stay in this position for 5 seconds.  Slowly lift your head and make your tailbone point up to the ceiling so your back hangs low (sags) like the back of a cow.  Stay in this position for 5 seconds. Press-Ups Do these steps 5-10 times in a row: 1. Lie on your belly (face-down) on the floor. 2. Place your hands near your head, about shoulder-width apart. 3. While you keep your back relaxed and keep your hips on the floor, slowly straighten your arms to raise the top half of your body and lift your shoulders. Do not use your back muscles. To make yourself more comfortable, you may change where you place your hands. 4. Stay in this position for 5 seconds. 5. Slowly return to lying flat on the floor. Bridges Do these steps 10 times in a row: 1. Lie on your back on a firm surface. 2. Bend your knees so they point up to the ceiling. Your feet should be flat on the floor. 3. Tighten your butt muscles and lift your butt off of the floor until your waist is almost as high as your knees. If you do not feel the muscles working in  your butt and the back of your thighs, slide your feet 1-2 inches farther away from your butt. 4. Stay in this position for 3-5 seconds. 5. Slowly lower your butt to the floor, and let your butt muscles relax. If this exercise is too easy, try doing it with your arms crossed over your chest. Belly Crunches Do these steps 5-10 times in a row: 1. Lie on your back on a firm bed or the floor with your legs stretched out. 2. Bend your knees so they point up to the ceiling. Your feet should be flat on the floor. 3. Cross your arms over your chest. 4. Tip your chin a little bit toward your chest but do not bend your neck. 5. Tighten your belly muscles and slowly raise your chest just enough to lift your shoulder blades a tiny bit off of the  floor. 6. Slowly lower your chest and your head to the floor. Back Lifts Do these steps 5-10 times in a row: 1. Lie on your belly (face-down) with your arms at your sides, and rest your forehead on the floor. 2. Tighten the muscles in your legs and your butt. 3. Slowly lift your chest off of the floor while you keep your hips on the floor. Keep the back of your head in line with the curve in your back. Look at the floor while you do this. 4. Stay in this position for 3-5 seconds. 5. Slowly lower your chest and your face to the floor. GET HELP IF:  Your back pain gets a lot worse when you do an exercise.  Your back pain does not lessen 2 hours after you exercise. If you have any of these problems, stop doing the exercises. Do not do them again unless your doctor says it is okay. GET HELP RIGHT AWAY IF:  You have sudden, very bad back pain. If this happens, stop doing the exercises. Do not do them again unless your doctor says it is okay.   This information is not intended to replace advice given to you by your health care provider. Make sure you discuss any questions you have with your health care provider.   Document Released: 07/08/2010 Document Revised: 02/24/2015 Document Reviewed: 07/30/2014 Elsevier Interactive Patient Education 2016 Elsevier Inc.  Back Pain, Adult Back pain is very common. The pain often gets better over time. The cause of back pain is usually not dangerous. Most people can learn to manage their back pain on their own.  HOME CARE  Watch your back pain for any changes. The following actions may help to lessen any pain you are feeling:  Stay active. Start with short walks on flat ground if you can. Try to walk farther each day.  Exercise regularly as told by your doctor. Exercise helps your back heal faster. It also helps avoid future injury by keeping your muscles strong and flexible.  Do not sit, drive, or stand in one place for more than 30 minutes.  Do not  stay in bed. Resting more than 1-2 days can slow down your recovery.  Be careful when you bend or lift an object. Use good form when lifting:  Bend at your knees.  Keep the object close to your body.  Do not twist.  Sleep on a firm mattress. Lie on your side, and bend your knees. If you lie on your back, put a pillow under your knees.  Take medicines only as told by your doctor.  Put ice on the  injured area.  Put ice in a plastic bag.  Place a towel between your skin and the bag.  Leave the ice on for 20 minutes, 2-3 times a day for the first 2-3 days. After that, you can switch between ice and heat packs.  Avoid feeling anxious or stressed. Find good ways to deal with stress, such as exercise.  Maintain a healthy weight. Extra weight puts stress on your back. GET HELP IF:   You have pain that does not go away with rest or medicine.  You have worsening pain that goes down into your legs or buttocks.  You have pain that does not get better in one week.  You have pain at night.  You lose weight.  You have a fever or chills. GET HELP RIGHT AWAY IF:   You cannot control when you poop (bowel movement) or pee (urinate).  Your arms or legs feel weak.  Your arms or legs lose feeling (numbness).  You feel sick to your stomach (nauseous) or throw up (vomit).  You have belly (abdominal) pain.  You feel like you may pass out (faint).   This information is not intended to replace advice given to you by your health care provider. Make sure you discuss any questions you have with your health care provider.   Document Released: 11/22/2007 Document Revised: 06/26/2014 Document Reviewed: 10/07/2013 Elsevier Interactive Patient Education 2016 [employer].   Emergency Department Resource Guide 1) Find a Doctor and Pay Out of Pocket Although you won't have to find out who is covered by your insurance plan, it is a good idea to ask around and get recommendations. You will then  need to call the office and see if the doctor you have chosen will accept you as a new patient and what types of options they offer for patients who are self-pay. Some doctors offer discounts or will set up payment plans for their patients who do not have insurance, but you will need to ask so you aren't surprised when you get to your appointment.  2) Contact Your Local Health Department Not all health departments have doctors that can see patients for sick visits, but many do, so it is worth a call to see if yours does. If you don't know where your local health department is, you can check in your phone book. The CDC also has a tool to help you locate your state's health department, and many state websites also have listings of all of their local health departments.  3) Find a Walk-in Clinic If your illness is not likely to be very severe or complicated, you may want to try a walk in clinic. These are popping up all over the country in pharmacies, drugstores, and shopping centers. They're usually staffed by nurse practitioners or physician assistants that have been trained to treat common illnesses and complaints. They're usually fairly quick and inexpensive. However, if you have serious medical issues or chronic medical problems, these are probably not your best option.  No Primary Care Doctor: - Call Health Connect at  [phone] - they can help you locate a primary care doctor that  accepts your insurance, provides certain services, etc. - Physician Referral Service- [phone]  Chronic Pain Problems: Organization         Address  Phone   Notes  [facility]  [phone] Patients need to be referred by their primary care doctor.   Medication Assistance: Organization  Address  Phone   Notes  [facility]ication [facility] [address] [place], [place] [address] [phone]) [phone] --Must be a resident of [place] -- Must have NO  insurance coverage whatsoever (no Medicaid/ Medicare, etc.) -- The pt. MUST have a primary care doctor that directs their care regularly and follows them in the community   MedAssist  [phone]) [phone]   [employer]  [phone]    Agencies that provide inexpensive medical care: Organization         Address  Phone   Notes  [facility]  [phone]) [phone]   [facility]    [phone]   [facility] [address] [place], [place] [address] [phone]) [phone]   [facility] [place] [address], [place] [phone]   Planned Parenthood    [phone]) [phone]   [facility]    [phone]   [facility]  [address], [place] Phone:  [phone]) [phone], Fax:  [phone]) [phone] Hours of Operation:  9 am - 6 pm, M-F.  Also accepts Medicaid/Medicare and self-pay.  [facility]  [address], [place] Phone: [phone]) [phone], Fax: [phone]. Hours of Operation:  8:30 am - 5:30 pm, M-F.  Also accepts Medicaid and self-pay.  [facility] [place] [address], [place] Phone: [phone](336) [phone]   [facility] [address], [place], [place] [phone], Ext. [phone] Mondays & Thursdays: 7-9 AM.  First 15 patients are seen on a first come, first serve basis.    Medicaid-accepting [facility] Providers:  Organization         Address  Phone   Notes  [facility] [address], [place] [phone] Also accepts self-pay patients.  [facility] [address], [place]  [phone]) [phone]   [facility] [address], [place] [phone]) [phone]   [facility] [address] [place] [address], [place] [phone]) [phone]   Renaye RakersVeita Bland [address], [place]   [phone]) [phone] Only accepts [place] Access [place] patients after they have  their name applied to their card.   Self-Pay (no insurance) in [facility] [place]:  Organization         Address  Phone   Notes  Sickle Cell Patients, [facility] [address], [place] [phone]) [phone]   [facility] [address], [place] [phone]) [phone]   [facility] [place]  [address], [place] [phone]) [phone]   [facility]/Dr. Osei-Bonsu  [address] [place] [address], [place] or [phone], [place] [phone] Phone number for both [place] and [place] locations is the same.  [facility] [address], [place] [phone]) [phone]   [facility]  [address] [place] [address], [place] or [address] [phone]   [facility] [address], [place] [phone], phone; [phone]) [phone], fax Sees patients 1st and 3rd Saturday of every month.  Must not qualify for public or private insurance (i.e. Medicaid, Medicare, [place] Health Choice, Veterans' Benefits)  Household income should be no more than 200% of the poverty level The clinic cannot treat you if you are pregnant or think you  are pregnant  Sexually transmitted diseases are not treated at the clinic.    Dental Care: Organization         Address  Phone  Notes  [facility] Department of [facility] [address] [place], [place] [phone] Accepts children up to age 44 who are enrolled in [place] or [place] Health Choice; pregnant women with a Medicaid card; and children who have applied for Medicaid or [place] Health Choice, but were declined, whose parents can pay a reduced fee at time of service.  [facility] Department of [facility]  [address], [place] [phone] Accepts children up to age 62 who are enrolled in [place] or [place] Health Choice; pregnant women with a Medicaid card; and children who have applied  for Medicaid or [place] Health Choice, but were declined, whose parents can pay a reduced fee at time of service.  [facility]  [address] [place], [place] [phone] Patients are seen by appointment only. Walk-ins are not accepted. [facility] will see patients 12 years of age and older. Monday - Tuesday (8am-5pm) Most Wednesdays (8:30-5pm) $30 per visit, cash only  [facility] Adult Dental Access PROGRAM  [address], [facility] [phone] Patients are seen by appointment only. Walk-ins are not accepted. [facility] will see patients 43 years of age and older. One Wednesday Evening (Monthly: Volunteer Based).  $30 per visit, cash only  [employer]  [phone] for adults; Children under age 80, call Graduate Pediatric Dentistry at [phone]. Children aged 54-14, please call [phone] to request a pediatric application.  Dental services are provided in all areas of dental care including fillings, crowns and bridges, complete and partial dentures, implants, gum treatment, root canals, and extractions. Preventive care is also provided. Treatment is provided to both adults and children. Patients are selected via a lottery and there is often a waiting list.   [facility] [address] [place]  [phone] [email or web address]   [facility] [address] [place], [place] [phone], Ext. [phone] Second and Fourth Thursday of each month, opens at 6:30 AM; Clinic ends at 9 AM.  Patients are seen on a first-come first-served basis, and a limited number are seen during each clinic.   [facility]  [address] [place], [place] [phone]   Eligibility Requirements You must have lived in [place], [place], or [place] counties for at least the last three months.   You cannot be eligible for state or federal sponsored [place], including [employer],  [place], or [employer].   You generally cannot be eligible for healthcare insurance through your employer.    How to apply: Eligibility screenings are held every Tuesday and Wednesday afternoon from 1:00 pm until 4:00 pm. You do not need an appointment for the interview!  [facility] [address], [place], [place] [phone]   [facility] Health Department  [phone]   [facility] Health Department  [phone]   [facility] Health Department  [phone]    Behavioral Health Resources in the Community: Intensive Outpatient Programs Organization         Address  Phone  Notes  [facility] Services [address], [place], [place] [phone]   [facility] Outpatient [address] [place], [place] [phone]   [facility] [address], [place], [place]  [phone]   [facility] Mental Health [address]  [place], [place] [phone] or [phone]   Substance Abuse Resources Organization         Address  Phone  Notes  [facility] Drug Services  [phone]   [facility]  [phone]   [facility] [place]  [phone]   Floydene Flock  [phone]   Residential & Outpatient Substance Abuse Program  [phone]   Psychological Services Organization         Address  Phone  Notes  [facility]  [phone]   [facility] Services  [phone]   [facility] Mental Health [address], [place] [phone] or [phone]    Mobile Crisis Teams Organization         Address  Phone  Notes  [facility], Mobile Crisis Care Unit  [phone]   [facility]  [address] [place], [place] [phone]   Doristine Locks [address] [place] [phone]    Self-Help/Support Groups Organization         Address  Phone             Notes  [facility] [place] - variety of support  groups  336- I7437963 Call for more information  Narcotics Anonymous (NA), [facility] [address], [employer] [place]  2 meetings at this location   Statistician         Address  Phone  Notes  [facility] [address],    [place]  [phone]   [facility]  [address], [place] [address], [place], [place] [phone]   [facility] [address] [place], [place] [phone] Admissions: 8am-3pm M-F  [facility] [address]    [place], [place] [phone]   [facility] [address] [place], [place], [place] [phone]   [facility] [address],  [place], [place] [phone]   [facility] - Intensive Outpatient [address], [place], [place] [phone]   [facility] (Addiction Recovery [facility] [address] [place]  [place], [place] [phone] or [phone]   [facility] ([facility]) [address], [place], [place] [phone] Accepts Medicaid  [facility] [address] [place] [address],  [place] [phone] Substance Abuse/Addiction Treatment   [facility] Organization         Address  Phone  Notes  [facility]  [phone]   Angie Fava, PhD [address] [place], [place]   [phone] or [phone]   [facility] Behavioral   [address] [place], [place] [phone]   [facility] [address] [place], [place] [phone] Insurance/Medicaid/sponsorship through [facility] and Families [address]                                    [place], [place] [phone] Therapy/tele-psych/case  [facility] [address], [place] [phone]    Dr. Lolly Mustache  [phone]   [facility]  [employer] [facility] Dept. 1) [address], [place] 2) [address], [place] 3)   [address], [place] [phone]  [phone]   [facility] Child Abuse Hotline (  336) L7645479 or (336) [phone])[phone] (After Hours)

## 2015-07-23 ENCOUNTER — Ambulatory Visit: Payer: Medicare Other

## 2015-07-26 ENCOUNTER — Ambulatory Visit (INDEPENDENT_AMBULATORY_CARE_PROVIDER_SITE_OTHER): Payer: Medicare Other | Admitting: Neurology

## 2015-07-26 ENCOUNTER — Encounter: Payer: Self-pay | Admitting: Neurology

## 2015-07-26 VITALS — BP 114/69 | HR 67 | Ht 64.5 in | Wt 144.0 lb

## 2015-07-26 DIAGNOSIS — R269 Unspecified abnormalities of gait and mobility: Secondary | ICD-10-CM

## 2015-07-26 DIAGNOSIS — R251 Tremor, unspecified: Secondary | ICD-10-CM | POA: Insufficient documentation

## 2015-07-26 DIAGNOSIS — R413 Other amnesia: Secondary | ICD-10-CM

## 2015-07-26 HISTORY — DX: Other amnesia: R41.3

## 2015-07-26 HISTORY — DX: Unspecified abnormalities of gait and mobility: R26.9

## 2015-07-26 HISTORY — DX: Tremor, unspecified: R25.1

## 2015-07-26 NOTE — Patient Instructions (Signed)
Tremor °A tremor is trembling or shaking that you cannot control. Most tremors affect the hands or arms. Tremors can also affect the head, vocal cords, face, and other parts of the body. There are many types of tremors. Common types include:  °· Essential tremor. These usually occur in people over the age of 40. It may run in families and can happen in otherwise healthy people.   °· Resting tremor. These occur when the muscles are at rest, such as when your hands are resting in your lap. People with Parkinson disease often have resting tremors.   °· Postural tremor. These occur when you try to hold a pose, such as keeping your hands outstretched.   °· Kinetic tremor. These occur during purposeful movement, such as trying to touch a finger to your nose.   °· Task-specific tremor. These may occur when you perform tasks such as handwriting, speaking, or standing.   °· Psychogenic tremor. These dramatically lessen or disappear when you are distracted. They can happen in people of all ages.   °Some types of tremors have no known cause. Tremors can also be a symptom of nervous system problems (neurological disorders) that may occur with aging. Some tremors go away with treatment while others do not.  °HOME CARE INSTRUCTIONS °Watch your tremor for any changes. The following actions may help to lessen any discomfort you are feeling: °· Take medicines only as directed by your health care provider.   °· Limit alcohol intake to no more than 1 drink per day for nonpregnant women and 2 drinks per day for men. One drink equals 12 oz of beer, 5 oz of wine, or 1½ oz of hard liquor. °· Do not use any tobacco products, including cigarettes, chewing tobacco, or electronic cigarettes. If you need help quitting, ask your health care provider.   °· Avoid extreme heat or cold.    °· Limit the amount of caffeine you consume as directed by your health care provider.   °· Try to get 8 hours of sleep each night. °· Find ways to manage your  stress, such as meditation or yoga. °· Keep all follow-up visits as directed by your health care provider. This is important. °SEEK MEDICAL CARE IF: °· You start having a tremor after starting a new medicine. °· You have tremor with other symptoms such as: °¨ Numbness. °¨ Tingling. °¨ Pain. °¨ Weakness. °· Your tremor gets worse. °· Your tremor interferes with your day-to-day life. °  °This information is not intended to replace advice given to you by your health care provider. Make sure you discuss any questions you have with your health care provider. °  °Document Released: 05/26/2002 Document Revised: 06/26/2014 Document Reviewed: 12/01/2013 °Elsevier Interactive Patient Education ©2016 Elsevier Inc. ° °

## 2015-07-26 NOTE — Progress Notes (Signed)
Reason for visit: Tremor  Referring physician: Dr. Wynn Maudlin is a 55 y.o. female  History of present illness:  [name] is a 55 year old right-handed white female with history of intermittent tremors dating back 5 years affecting the right hand. The patient would note a resting type tremor that would be present at times, but mainly would not be an issue. She believes that these tremors have worsened over the last 4 months. The patient is also noted some right upper eyelid twitching at times. The patient has noted that her handwriting has become smaller and more sloppy. She has difficulty with typing. She also has had a decline in memory issues, difficulty with spelling. The patient is in school currently, she requires extended periods of time for testing. The patient believes that there is some slight weakness on the right side, she has had a change in balance over the last 10 years, she has been using a cane intermittently over the last 5-6 years for ambulation. The patient claims to have a history of lupus, but she is on no medication for treatment of lupus. She has a history of interstitial cystitis and fibromyalgia. She has chronic pain associated with this, she takes methadone on a daily basis. She comes to the office today for an evaluation.  Past Medical History  Diagnosis Date  . Lupus (systemic lupus erythematosus) (HCC)   . Fibromyalgia   . Thyroid disease   . Interstitial cystitis   . Diabetes mellitus without complication (HCC)   . Raynaud disease   . Sjogren's disease (HCC)   . Liver disease     fatty liver; non-alcoholic    Past Surgical History  Procedure Laterality Date  . Abdominal hysterectomy    . Breast surgery    . Nose surgery      Family History  Problem Relation Age of Onset  . Stroke Mother   . Heart disease Father   . Stroke Father   . Heart attack Father   . Arthritis Father   . Diabetes Father   . Breast cancer Maternal Aunt      3 mat aunts with breast cancer  . Heart disease Maternal Grandmother   . Cancer Maternal Grandmother   . Arthritis Maternal Grandmother   . Diabetes Maternal Grandmother   . Heart disease Maternal Grandfather   . Cancer Maternal Grandfather   . Arthritis Maternal Grandfather   . Heart disease Paternal Grandmother   . Arthritis Paternal Grandmother   . Stroke Paternal Grandmother   . Heart attack Paternal Grandmother   . Diabetes Paternal Grandmother   . Heart disease Paternal Grandfather   . Arthritis Paternal Grandfather   . Heart attack Paternal Grandfather   . Diabetes Paternal Grandfather   . Diabetes Brother     Social history:  reports that she has never smoked. She has never used smokeless tobacco. She reports that she does not drink alcohol or use illicit drugs.  Medications:  Prior to Admission medications   Medication Sig Start Date End Date Taking? Authorizing Provider  buPROPion (WELLBUTRIN XL) 150 MG 24 hr tablet Take 150 mg by mouth daily. 05/07/15 05/06/16 Yes Historical Provider, MD  canagliflozin (INVOKANA) 100 MG TABS tablet Take 100 mg by mouth daily.   Yes Historical Provider, MD  gabapentin (NEURONTIN) 300 MG capsule Take 300 mg by mouth 2 (two) times daily.  05/26/15  Yes Historical Provider, MD  HUMULIN N KWIKPEN 100 UNIT/ML Kiwkpen Inject 20 Units  into the skin 2 (two) times daily. 05/21/15  Yes Historical Provider, MD  HYDROcodone-acetaminophen (NORCO/VICODIN) 5-325 MG tablet Take 1 tablet by mouth every 6 (six) hours as needed for moderate pain.   Yes Historical Provider, MD  levothyroxine (SYNTHROID, LEVOTHROID) 150 MCG tablet Take 150 mcg by mouth daily before breakfast.   Yes Historical Provider, MD  methadone (DOLOPHINE) 10 MG tablet Take 20-40 mg by mouth 2 (two) times daily. Take 40mg  in the morning, and then 30 mg at night   Yes Historical Provider, MD  nystatin-triamcinolone (MYCOLOG II) cream Apply 1 application topically 4 (four) times daily as  needed. irritation 05/26/15  Yes Historical Provider, MD  pantoprazole (PROTONIX) 40 MG tablet Take 40 mg by mouth daily.   Yes Historical Provider, MD  polyethylene glycol powder (GLYCOLAX/MIRALAX) powder Take 17 g by mouth daily as needed. constipation 05/26/15  Yes Historical Provider, MD  traZODone (DESYREL) 100 MG tablet Take 300 mg by mouth at bedtime.   Yes Historical Provider, MD  VOLTAREN 1 % GEL Apply 1 application topically 2 (two) times daily as needed. pain 05/26/15  Yes Historical Provider, MD      Allergies  Allergen Reactions  . Ivp Dye [Iodinated Diagnostic Agents] Hives    Hallucinations.   . Other     Smoke- cigarette/cigar: throat closes up, watering eyes  . Penicillins     ROS:  Out of a complete 14 system review of symptoms, the patient complains only of the following symptoms, and all other reviewed systems are negative.  Tremor Memory disturbance  Blood pressure 114/69, pulse 67, height 5' 4.5" (1.638 m), weight 144 lb (65.318 kg).  Physical Exam  General: The patient is alert and cooperative at the time of the examination.  Eyes: Pupils are equal, round, and reactive to light. Discs are flat bilaterally.  Neck: The neck is supple, no carotid bruits are noted.  Respiratory: The respiratory examination is clear.  Cardiovascular: The cardiovascular examination reveals a regular rate and rhythm, no obvious murmurs or rubs are noted.  Skin: Extremities are without significant edema.  Neurologic Exam  Mental status: The patient is alert and oriented x 3 at the time of the examination. The patient has apparent normal recent and remote memory, with an apparently normal attention span and concentration ability. The Mini-Mental Status Examination done today shows a total score 26/30. The patient is able to name 9 animals in 30 seconds.  Cranial nerves: Facial symmetry is present. There is good sensation of the face to pinprick and soft touch bilaterally. The  strength of the facial muscles and the muscles to head turning and shoulder shrug are normal bilaterally. Speech is well enunciated, no aphasia or dysarthria is noted. Extraocular movements are full. Visual fields are full. The tongue is midline, and the patient has symmetric elevation of the soft palate. No obvious hearing deficits are noted.  Motor: The motor testing reveals 5 over 5 strength of all 4 extremities. Good symmetric motor tone is noted throughout.  Sensory: Sensory testing is intact to pinprick, soft touch, vibration sensation, and position sense on all 4 extremities, with exception of some decrease in pin fixation of the right hand. No evidence of extinction is noted.  Coordination: Cerebellar testing reveals good finger-nose-finger and heel-to-shin bilaterally. No tremors are seen.  Gait and station: Gait is normal. Tandem gait is slightly unsteady. Romberg is negative. No drift is seen.  Reflexes: Deep tendon reflexes are symmetric and normal bilaterally. Toes are downgoing bilaterally.  Assessment/Plan:  1. Reported tremors  2. Memory disturbance  3. Reported history of lupus  4. Reported history of fibromyalgia  5. Reported history of interstitial cystitis  6. Chronic pain syndrome  The patient has a relatively unremarkable objective clinical examination. I see no evidence of parkinsonism. MRI evaluation of the brain has been set up, this has not yet been done. The patient will have blood work done today, she will follow-up in several months to reevaluate the tremor, but no tremor seen today. I will contact her with results of the blood studies. The memory issue will be followed over time.  Marlan Palau MD 07/26/2015 5:04 PM  [facility] [address] [place], [place] [address]  Phone [phone] Fax [phone]

## 2015-07-27 ENCOUNTER — Telehealth: Payer: Self-pay | Admitting: Neurology

## 2015-07-27 LAB — ENA+DNA/DS+SJORGEN'S
ENA RNP Ab: 1.2 AI — ABNORMAL HIGH (ref 0.0–0.9)
ENA SM Ab Ser-aCnc: 0.2 AI (ref 0.0–0.9)
ENA SSA (RO) Ab: >8 AI — ABNORMAL HIGH (ref 0.0–0.9)
ENA SSB (LA) Ab: <0.2 AI (ref 0.0–0.9)
dsDNA Ab: 1 IU/mL (ref 0–9)

## 2015-07-27 LAB — TSH: TSH: 1.54 u[IU]/mL (ref 0.450–4.500)

## 2015-07-27 LAB — RPR: RPR: NONREACTIVE

## 2015-07-27 LAB — SEDIMENTATION RATE: SED RATE: 29 mm/h (ref 0–40)

## 2015-07-27 LAB — VITAMIN B12: VITAMIN B 12: 1212 pg/mL — AB (ref 211–946)

## 2015-07-27 LAB — ANA W/REFLEX: Anti Nuclear Antibody(ANA): POSITIVE — AB

## 2015-07-27 LAB — HIV ANTIBODY (ROUTINE TESTING W REFLEX): HIV Screen 4th Generation wRfx: NONREACTIVE

## 2015-07-27 NOTE — Telephone Encounter (Signed)
I called patient. The blood work did show a positive RNP antibody, positive RNP antibody and SS a antibody consistent with lupus. The patient is not being treated for lupus. The MRI the brain is pending.

## 2015-07-29 ENCOUNTER — Ambulatory Visit: Payer: Medicare Other | Attending: Pediatrics | Admitting: Speech Pathology

## 2015-09-07 ENCOUNTER — Other Ambulatory Visit: Payer: Self-pay | Admitting: Psychiatry

## 2015-09-07 DIAGNOSIS — G5139 Clonic hemifacial spasm, unspecified: Secondary | ICD-10-CM

## 2015-09-09 ENCOUNTER — Other Ambulatory Visit: Payer: Medicare Other

## 2015-09-12 ENCOUNTER — Other Ambulatory Visit: Payer: Medicare Other

## 2015-09-18 ENCOUNTER — Ambulatory Visit
Admission: RE | Admit: 2015-09-18 | Discharge: 2015-09-18 | Disposition: A | Discharge status: Home / Self Care | Payer: Medicare Other | Source: Ambulatory Visit | Attending: Psychiatry | Admitting: Psychiatry

## 2015-09-18 DIAGNOSIS — G5139 Clonic hemifacial spasm, unspecified: Secondary | ICD-10-CM

## 2015-12-04 ENCOUNTER — Encounter (HOSPITAL_COMMUNITY): Payer: Self-pay | Admitting: Emergency Medicine

## 2015-12-04 ENCOUNTER — Emergency Department (HOSPITAL_COMMUNITY)
Admission: EM | Admit: 2015-12-04 | Discharge: 2015-12-04 | Disposition: A | Discharge status: Home / Self Care | Payer: Medicare Other | Attending: Emergency Medicine | Admitting: Emergency Medicine

## 2015-12-04 DIAGNOSIS — Y929 Unspecified place or not applicable: Secondary | ICD-10-CM | POA: Diagnosis not present

## 2015-12-04 DIAGNOSIS — T1592XA Foreign body on external eye, part unspecified, left eye, initial encounter: Secondary | ICD-10-CM | POA: Insufficient documentation

## 2015-12-04 DIAGNOSIS — X58XXXA Exposure to other specified factors, initial encounter: Secondary | ICD-10-CM | POA: Insufficient documentation

## 2015-12-04 DIAGNOSIS — Y999 Unspecified external cause status: Secondary | ICD-10-CM | POA: Insufficient documentation

## 2015-12-04 DIAGNOSIS — Y939 Activity, unspecified: Secondary | ICD-10-CM | POA: Insufficient documentation

## 2015-12-04 DIAGNOSIS — E119 Type 2 diabetes mellitus without complications: Secondary | ICD-10-CM | POA: Diagnosis not present

## 2015-12-04 DIAGNOSIS — Z79899 Other long term (current) drug therapy: Secondary | ICD-10-CM | POA: Diagnosis not present

## 2015-12-04 MED ORDER — DIPHENHYDRAMINE HCL 25 MG PO CAPS
25.0000 mg | ORAL_CAPSULE | Freq: Once | ORAL | Status: AC
  Administered 2015-12-04: 25 mg via ORAL
  Filled 2015-12-04: qty 1

## 2015-12-04 MED ORDER — TETRACAINE HCL 0.5 % OP SOLN
2.0000 [drp] | Freq: Once | OPHTHALMIC | Status: AC
  Administered 2015-12-04: 2 [drp] via OPHTHALMIC
  Filled 2015-12-04: qty 4

## 2015-12-04 MED ORDER — FLUORESCEIN SODIUM 1 MG OP STRP
1.0000 | ORAL_STRIP | Freq: Once | OPHTHALMIC | Status: AC
  Administered 2015-12-04: 1 via OPHTHALMIC
  Filled 2015-12-04: qty 1

## 2015-12-04 NOTE — ED Notes (Signed)
Patient complaint of foreign body in Left eye. Patient has had EMS flush it but it is still in there. This is causing pain in her left eye. Patient does not know how it got in her eye or what it is.

## 2015-12-04 NOTE — Discharge Instructions (Signed)
Eye Foreign Body  A foreign body refers to any object on the surface of the eye or in the eyeball that should not be there. A foreign body may be a small speck of dirt or dust, a hair or eyelash, a splinter, or any other object.   SIGNS AND SYMPTOMS  Symptoms depend on what the foreign body is and where it is in the eye. The most common locations are:    On the inner surface of the upper or lower eyelids or on the covering of the white part of the eye (conjunctiva). Symptoms in this location are:    Pain and irritation, especially when blinking.    The feeling that something is in the eye.   On the surface of the clear covering on the front of the eye (cornea). Symptoms in this location include:    Pain and irritation.     Small "rust rings" around a metallic foreign body.    The feeling that something is in the eye.    Inside the eyeball. Foreign bodies inside the eye may cause:     Great pain.     Immediate loss of vision.     Distortion of the pupil.  DIAGNOSIS   Foreign bodies are found during an exam by an eye specialist. Those on the eyelids, conjunctiva, or cornea are usually (but not always) easily found. When a foreign body is inside the eyeball, a cloudiness of the lens (cataract) may form almost right away. This makes it hard for an eye specialist to find the foreign body. Tests may be needed, including ultrasound testing, X-rays, and CT scans.  TREATMENT    Foreign bodies on the eyelids, conjunctiva, or cornea are often removed easily and painlessly.   Rust in the cornea may require the use of a drill-like instrument to remove the rust.   If the foreign body has caused a scratch or a rubbing or scraping (abrasion) of the cornea, this may be treated with antibiotic drops or ointment. A pressure patch may be put over your eye.   If the foreign body is inside your eyeball, surgery is needed right away. This is a medical emergency. Foreign bodies inside the eye threaten vision. A person may even  lose his or her eye.  HOME CARE INSTRUCTIONS    Take medicines only as directed by your health care provider. Use eye drops or ointment as directed.   If no eye patch was applied:    Keep your eye closed as much as possible.    Do not rub your eye.    Wear dark glasses as needed to protect your eyes from bright light.    Do not wear contact lenses until your eye feels normal again, or as instructed by your health care provider.    Wear a protective eye covering if there is a risk of eye injury. This is important when working with high-speed tools.   If your eye is patched:    Follow your health care provider's instructions for when to remove the patch.    Do notdrive or operate machinery if your eye is patched. Your ability to judge distances is impaired.   Keep all follow-up visits as directed by your health care provider. This is important.  SEEK MEDICAL CARE IF:    You have increased pain in your eye.   Your vision gets worse.    You have problems with your eye patch.    You have fluid (discharge)   coming from your injured eye.    You have redness and swelling around your affected eye.   MAKE SURE YOU:    Understand these instructions.   Will watch your condition.   Will get help right away if you are not doing well or get worse.     This information is not intended to replace advice given to you by your health care provider. Make sure you discuss any questions you have with your health care provider.     Document Released: 06/05/2005 Document Revised: 06/26/2014 Document Reviewed: 10/31/2012  Elsevier Interactive Patient Education 2016 Elsevier Inc.

## 2015-12-04 NOTE — ED Notes (Signed)
Pt. Visual acuity: rt.eye:20/40, lt. Eye: 20/400, both eyes:20/50, no corrective lenses. With corrective lenses: both eyes:20/50, rt. Eye:20/30, lt. Eye: 20/200.
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
## 2016-01-24 ENCOUNTER — Ambulatory Visit: Payer: Medicare Other | Admitting: Neurology

## 2016-06-19 HISTORY — PX: OTHER SURGICAL HISTORY: SHX169

## 2016-08-26 DIAGNOSIS — I2510 Atherosclerotic heart disease of native coronary artery without angina pectoris: Secondary | ICD-10-CM | POA: Insufficient documentation

## 2016-08-26 HISTORY — DX: Atherosclerotic heart disease of native coronary artery without angina pectoris: I25.10

## 2017-01-12 ENCOUNTER — Other Ambulatory Visit: Payer: Self-pay | Admitting: *Deleted

## 2017-01-12 ENCOUNTER — Encounter: Payer: Self-pay | Admitting: *Deleted

## 2017-01-15 ENCOUNTER — Ambulatory Visit: Payer: Medicare Other | Admitting: Diagnostic Neuroimaging

## 2017-01-15 ENCOUNTER — Telehealth: Payer: Self-pay | Admitting: Neurology
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
## 2017-01-21 DIAGNOSIS — R079 Chest pain, unspecified: Secondary | ICD-10-CM | POA: Insufficient documentation

## 2017-01-21 HISTORY — DX: Chest pain, unspecified: R07.9

## 2017-03-22 DIAGNOSIS — E1142 Type 2 diabetes mellitus with diabetic polyneuropathy: Secondary | ICD-10-CM

## 2017-03-22 DIAGNOSIS — M797 Fibromyalgia: Secondary | ICD-10-CM | POA: Insufficient documentation

## 2017-03-22 HISTORY — DX: Type 2 diabetes mellitus with diabetic polyneuropathy: E11.42

## 2017-03-26 ENCOUNTER — Encounter: Payer: Self-pay | Admitting: Neurology

## 2017-06-14 DIAGNOSIS — Z01818 Encounter for other preprocedural examination: Secondary | ICD-10-CM

## 2017-06-15 ENCOUNTER — Ambulatory Visit: Payer: Medicare Other | Admitting: Neurology

## 2017-07-27 DIAGNOSIS — I639 Cerebral infarction, unspecified: Secondary | ICD-10-CM | POA: Insufficient documentation

## 2017-07-27 HISTORY — DX: Cerebral infarction, unspecified: I63.9

## 2017-08-27 ENCOUNTER — Encounter: Payer: Self-pay | Admitting: Neurology

## 2017-08-27 ENCOUNTER — Other Ambulatory Visit: Payer: Self-pay

## 2017-08-27 ENCOUNTER — Ambulatory Visit (INDEPENDENT_AMBULATORY_CARE_PROVIDER_SITE_OTHER): Payer: Medicare Other | Admitting: Neurology

## 2017-08-27 VITALS — BP 114/72 | HR 81 | Ht 64.0 in | Wt 142.0 lb

## 2017-08-27 DIAGNOSIS — I639 Cerebral infarction, unspecified: Secondary | ICD-10-CM | POA: Diagnosis not present

## 2017-08-27 DIAGNOSIS — R251 Tremor, unspecified: Secondary | ICD-10-CM | POA: Diagnosis not present

## 2017-08-27 DIAGNOSIS — R413 Other amnesia: Secondary | ICD-10-CM

## 2017-08-27 NOTE — Patient Instructions (Addendum)
1. Schedule 72-hour EEG 2. Continue control of blood pressure, cholesterol, and daily aspirin and Plavix 3. Use walker at all times 4. Bloodwork from [facility] will be requested for review, if not done, we will order a B12 level 5. Continue with PT, speech therapy 6. Follow-up after EEG, call for any changes

## 2017-08-27 NOTE — Progress Notes (Signed)
NEUROLOGY CONSULTATION NOTE  Jamie Holmes MRN: [id number] DOB: [birth year]  Referring provider: Cletis Athens, NP Primary care provider: Cletis Athens, NP  Reason for consult:  Syncope, headaches  Thank you for your kind referral of Jamie Holmes for consultation of the above symptoms. Although her history is well known to you, please allow me to reiterate it for the purpose of our medical record. She is alone in the office today. Records and images were personally reviewed where available.  HISTORY OF PRESENT ILLNESS: This is a 57 year old right-handed woman presenting for evaluation of recurrent syncope and headaches. She had a stroke last month. She has seen 3 different neurologists in the past but does not remember seeing a neurologist. She started having syncopal episodes a year ago, she would find herself on the front yard or living room floor. She was initially having them once a month, then increased to once a week. Last episode was yesterday. She lives alone. She reports neighbors have seen her laying in her yard. One time she was walking her dog. Once in a while she has a very short warning of feeling strange. When she is at home, usually there is no warning. They mostly occur when standing, she has had maybe 2 while sitting, none while supine. No tongue bite. She has interstitial cystitis which causes some urinary incontinence, so she is unsure if she has had incontinence when she passes out. She denies any staring or unresponsive episodes, but for the past couple of years she has had gaps in time. She would be at her house thinking she needs to go to the grocery, then suddenly she is at the grocery and has not idea how she got there. She would be watching TV then suddenly something else is on and she does not recall the prior show. She has had issues with her right side even before the stroke last month that started around a year ago, she would have uncontrollable shaking of her right  hand and arm for 30 seconds. She would try to grab and hold her arm. Face is unaffected. She drops things in her right hand. She reports her right leg shakes when she is sitting. Left side is unaffected. She had seen neurologist Dr. Stephanie Acre in February 2017 for right-sided tremors. She had an MRI brain without contrast in April 2017 which I personally reviewed, no acute changes. Bloodwork showed positive ANA, ENA RNP Ab, and SS-A Ab. She reports being diagnosed with lupus but has not seen Rheumatology recently because she has been allergic to the medications, including prednisone. She saw neurologist Dr. Stacy Gardner in March 2017 for memory loss and right eyelid blepharospam. She had an EEG reporting slow transients in the left temporal and right temporal region, no definite epileptiform activity. She saw neurologist Dr. Theora Master in November 2018 for the syncopal episodes. She had carotid dopplers in August 2018 which did not show any significant stenosis. She was lost to follow-up each time. On 07/20/17, she presented to [facility] with chest pain and syncope. She was evaluated by Cardiology and had a cardiac cath on 07/20/17 and had PCI with DES to the dLCx and mLAD. At the time of discharge, she reported new onset right vision change with blurred vision and floaters. no further workup recommended since chest pain resolved. She went home and then to [facility] where she passed out and was brought back to the hospital where she had an MRI brain without contrast on  07/23/17 which showed a small hyperintensity in the left frontal parietal cortex near the motor strip on diffusion-weighted imaging. Repeat MRI brain without contrast on 07/27/16 reported that the small area of restricted diffusion had resolved. She reports having a couple of episodes of right hand/arm shaking while admitted. She was discharged home on aspirin and Plavix. She reports that since the stroke, the entire right side of her body is  still weak, her right cheek feels tingly, with spots of numbness on her right arm and right leg. Her right eye feels more blurred. She has had word-finding difficulties that became more prominent after the stroke, she says words that are not what she meant to say. She was dropping things with her right hand even before the stroke, but continues to report shaking in her right arm and hand that are more prominent when she gets upset ("if I get upset, it's going to happen"). She has had headaches for around 3-4 years, with more intense headaches occurring around 1-2 times a week. Pain is over the right occipital or vertex regions, with throbbing pain associated with light and sound sensitivity. No nausea/vomiting. She usually has a headache after she passes out or after the right-sided shaking. She feels very tired and sleepy after the right-sided shaking spells. She has also noticed a lot more memory issues. She had previously reported memory deficits to her prior neurologists. She would read an entire chapter of a book, then have to re-read it the next day. She has noticed a change in her handwriting, letters are more crunched together, which bothers her.   She denies any olfactory/gustatory hallucinations, rising epigastric sensation, myoclonic jerks. She takes gabapentin for fibromyalgia since the 1990s. Pain is everywhere. This has not helped with the headaches. She has been on Lexapro for anxiety for a year. She has a walker but fell yesterday without her walker. She occasionally gets choked on water and tea and gets esophageal dilatation. There is a family history of migraines in her mother and maternal/paternal great grandparents. She had a normal birth and early development.  There is no history of febrile convulsions, CNS infections such as meningitis/encephalitis, significant traumatic brain injury, neurosurgical procedures, or family history of seizures. She lives alone and does not drive.   PAST  MEDICAL HISTORY: Past Medical History:  Diagnosis Date  . Diabetes mellitus without complication (HCC)   . Fibromyalgia   . Interstitial cystitis   . Liver disease    fatty liver; non-alcoholic  . Lupus (systemic lupus erythematosus) (HCC)   . Raynaud disease   . Sjogren's disease (HCC)   . Thyroid disease     PAST SURGICAL HISTORY: Past Surgical History:  Procedure Laterality Date  . ABDOMINAL HYSTERECTOMY    . BILATERAL OOPHORECTOMY    . bladder tack    . BREAST SURGERY     lump, right breast  . heart stent  2018  . NOSE SURGERY      MEDICATIONS: Current Outpatient Medications on File Prior to Visit  Medication Sig Dispense Refill  . buPROPion (WELLBUTRIN XL) 150 MG 24 hr tablet Take 150 mg by mouth daily.    . canagliflozin (INVOKANA) 100 MG TABS tablet Take 100 mg by mouth daily.    Marland Kitchen gabapentin (NEURONTIN) 300 MG capsule Take 300 mg by mouth 2 (two) times daily.   0  . HUMULIN N KWIKPEN 100 UNIT/ML Kiwkpen Inject 20 Units into the skin 2 (two) times daily.  3  . HYDROcodone-acetaminophen (NORCO/VICODIN) 5-325  MG tablet Take 1 tablet by mouth every 6 (six) hours as needed for moderate pain.    Marland Kitchen levothyroxine (SYNTHROID, LEVOTHROID) 150 MCG tablet Take 150 mcg by mouth daily before breakfast.    . methadone (DOLOPHINE) 10 MG tablet Take 20-40 mg by mouth 2 (two) times daily. Take 40mg  in the morning, and then 30 mg at night    . nystatin-triamcinolone (MYCOLOG II) cream Apply 1 application topically 4 (four) times daily as needed. irritation  0  . pantoprazole (PROTONIX) 40 MG tablet Take 40 mg by mouth daily.    . polyethylene glycol powder (GLYCOLAX/MIRALAX) powder Take 17 g by mouth daily as needed. constipation    . traZODone (DESYREL) 100 MG tablet Take 300 mg by mouth at bedtime.    . VOLTAREN 1 % GEL Apply 1 application topically 2 (two) times daily as needed. pain  5   No current facility-administered medications on file prior to visit.      ALLERGIES: Allergies  Allergen Reactions  . Atorvastatin Other (See Comments)    Aches in leg  . Ivp Dye [Iodinated Diagnostic Agents] Hives    Hallucinations.   . Other     Smoke- cigarette/cigar: throat closes up, watering eyes  . Penicillins     Unknown. Childhood allergy.    . Prednisone Hives and Swelling    FAMILY HISTORY: Family History  Problem Relation Age of Onset  . Stroke Mother   . Heart disease Father   . Stroke Father   . Heart attack Father   . Arthritis Father   . Diabetes Father   . Breast cancer Maternal Aunt        3 mat aunts with breast cancer  . Heart disease Maternal Grandmother   . Cancer Maternal Grandmother   . Arthritis Maternal Grandmother   . Diabetes Maternal Grandmother   . Heart disease Maternal Grandfather   . Cancer Maternal Grandfather   . Arthritis Maternal Grandfather   . Heart disease Paternal Grandmother   . Arthritis Paternal Grandmother   . Stroke Paternal Grandmother   . Heart attack Paternal Grandmother   . Diabetes Paternal Grandmother   . Heart disease Paternal Grandfather   . Arthritis Paternal Grandfather   . Heart attack Paternal Grandfather   . Diabetes Paternal Grandfather   . Diabetes Brother     SOCIAL HISTORY: Social History   Socioeconomic History  . Marital status: Married    Spouse name: Not on file  . Number of children: 2  . Years of education: student  . Highest education level: Not on file  Social Needs  . Financial resource strain: Not on file  . Food insecurity - worry: Not on file  . Food insecurity - inability: Not on file  . Transportation needs - medical: Not on file  . Transportation needs - non-medical: Not on file  Occupational History  . Occupation: Consulting civil engineer  Tobacco Use  . Smoking status: Never Smoker  . Smokeless tobacco: Never Used  Substance and Sexual Activity  . Alcohol use: No  . Drug use: No  . Sexual activity: Not on file  Other Topics Concern  . Not on file   Social History Narrative   Patient drinks 2 sodas weekly.   Patient is right handed.     REVIEW OF SYSTEMS: Constitutional: No fevers, chills, or sweats, no generalized fatigue, change in appetite Eyes: No visual changes, double vision, eye pain Ear, nose and throat: No hearing loss, ear pain, nasal  congestion, sore throat Cardiovascular: No chest pain, palpitations Respiratory:  No shortness of breath at rest or with exertion, wheezes GastrointestinaI: No nausea, vomiting, diarrhea, abdominal pain, fecal incontinence Genitourinary:  No dysuria, urinary retention or frequency Musculoskeletal:  + neck pain, back pain Integumentary: No rash, pruritus, skin lesions Neurological: as above Psychiatric: No depression, insomnia, anxiety Endocrine: No palpitations, fatigue, diaphoresis, mood swings, change in appetite, change in weight, increased thirst Hematologic/Lymphatic:  No anemia, purpura, petechiae. Allergic/Immunologic: no itchy/runny eyes, nasal congestion, recent allergic reactions, rashes  PHYSICAL EXAM: Vitals:   08/27/17 1416  BP: 114/72  Pulse: 81  SpO2: 93%   General: No acute distress Head:  Normocephalic/atraumatic Eyes: Fundoscopic exam shows bilateral sharp discs, no vessel changes, exudates, or hemorrhages Neck: supple, no paraspinal tenderness, full range of motion Back: No paraspinal tenderness Heart: regular rate and rhythm Lungs: Clear to auscultation bilaterally. Vascular: No carotid bruits. Skin/Extremities: No rash, no edema Neurological Exam: Mental status: alert and oriented to person, place, and time, no dysarthria or aphasia, Fund of knowledge is appropriate.  Recent and remote memory are intact.  Attention and concentration are normal.    Able to name objects and repeat phrases.  MMSE - Mini Mental State Exam 08/27/2017 07/26/2015  Orientation to time 5 5  Orientation to Place 5 4  Registration 3 3  Attention/ Calculation 5 4  Recall 1 2  Language-  name 2 objects 2 2  Language- repeat 1 1  Language- follow 3 step command 3 2  Language- read & follow direction 1 1  Write a sentence 1 1  Copy design 1 1  Total score 28 26   Cranial nerves: CN I: not tested CN II: pupils equal, round and reactive to light, visual fields intact, fundi unremarkable. CN III, IV, VI:  full range of motion, no nystagmus, no ptosis CN V: decreased pin and cold on right V2, intact to pin on right V1, did not cross midline with pin CN VII: upper and lower face symmetric CN VIII: hearing intact to finger rub CN IX, X: gag intact, uvula midline CN XI: sternocleidomastoid and trapezius muscles intact CN XII: tongue midline Bulk & Tone: normal, no fasciculations. Motor: 5/5 on left UE and LE, +4/5 on right UE and LE, no pronator drift, with tremors on right UE pushing against resistance. Decreased FFM on right hand, orbiting around right hand (indicating mild right UE weakness) Sensation: decreased cold and pin on right UE and LE, with tingling on pin testing. Romberg test negative Deep Tendon Reflexes: +2 throughout, no ankle clonus Plantar responses: downgoing bilaterally Cerebellar: no incoordination on finger to nose testing Gait: favoring right leg with walker, unable to tandem walk Tremor: no resting tremor, tremor with active resistance on right UE. No tremor when writing  IMPRESSION: This is a 57 year old right-handed woman with a history of diabetes, fibromyalgia, lupus, presenting for several neurological symptoms including recurrent episodes of loss of consciousness, right arm/leg shaking, headaches, and memory loss. She was in the hospital in February 2019 for syncope after she had a cardiac cath, MRI brain showed restricted diffusion in the left frontoparietal region near the motor strip. It was felt that she had a stroke as a complication of the cardiac cath. Repeat imaging 4 days later showed resolution of restricted diffusion. She is taking aspirin  and Plavix for secondary stroke prevention. With episodes of right-sided shaking and loss of consciousness, seizures are also considered, with MRI brain potentially correlating with right-sided  symptoms. She continues to report right-sided difficulties despite most recent unremarkable brain MRI, there may be a psychogenic component as well. She will be scheduled for a 72-hour EEG to further classify her symptoms. We discussed memory issues, MMSE today normal 28/30. Bloodwork from her PCP will be requested for review, if B12 not done, we will order it. She was advised to continue control of vascular risk factors, PT, speech therapy, and use walker at all times. She does not drive. Follow-up after EEG, we may plan to start a daily headache preventative such as Topamax or Zonisamide on her next visit, she knows to call for any changes.   Thank you for allowing me to participate in the care of this patient. Please do not hesitate to call for any questions or concerns.   Patrcia DollyKaren Yusif Gnau, M.D.  CC: Cletis AthensMelissa Rose, NP

## 2017-09-27 ENCOUNTER — Telehealth: Payer: Self-pay | Admitting: Neurology

## 2017-09-27 NOTE — Telephone Encounter (Signed)
Patient states that she needs to see Karel Jarvisquino  before April 30th. She just saw Dr Karel JarvisAquino in march 2019. She will loose her license if the paper work is not turned [facility]. She wants to speak to someone about this matter because Dr Karel JarvisAquino is booked out until late Oct

## 2017-09-27 NOTE — Telephone Encounter (Signed)
Called and spoke with Pt. She states she has a form all her Drs must complete for the DMV. She states her employer went behind her back and called the DMV. She needs this completed before 10/16/17 to retain her license. Pt was to have a 72-hour EEG, which has not been scheduled. When I mentioned this to Pt, she acknowledged she had forgotten which Dr had an a test she was supposed to have. I advised her I will have Ala BentSusan Reid call her to schedule. I talked with Darl PikesSusan, she is aware to call Pt.

## 2017-09-28 ENCOUNTER — Telehealth: Payer: Self-pay

## 2017-09-28 NOTE — Telephone Encounter (Deleted)
-----   Message from Van ClinesKaren M Aquino, MD sent at 09/27/2017  9:48 PM EDT ----- She does not need the EEG for the DMV. I'm not sure what she is talking about. Would still do a 72-hour EEG on her. Eliott Amparan, pls call her and let her know the DMV only needs to know when her last episode of loss of consciousness was, because it has to be 6 months event-free before they can drive. She has a prior EEG which we can put [facility]. The 72-hour EEG is to find out what the cause of her current symptoms are, the DMV does not need that.  Thanks Clydie BraunKaren  ----- Message ----- From: Vallarie Mareeid, Susan R Sent: 09/27/2017   2:18 PM To: Sherilyn Bankerana T Smith, Karen M Aquino, MD  This patient was to be scheduled for a 72 hour ambulatory EEG the order was written on 08/27/17 and I never received a message about it. She called today stating she never received a phone call to schedule it and needs the results by April 30 for the [facility]. I have no available units until the 29th. Is there any way it could be a 24 hour? If so we could do it on April 17th. Dana at the front desk is aware of situation and will take care of scheduling and calling pt because I am not here on Fridays. Thank you, Please Advise.

## 2017-09-28 NOTE — Telephone Encounter (Signed)
LMOM informing pt that DMV does not require her EEG results, however they do need to know the date of her last LOC episode.

## 2017-10-15 ENCOUNTER — Other Ambulatory Visit: Payer: Medicare Other

## 2017-10-23 ENCOUNTER — Telehealth: Payer: Self-pay | Admitting: *Deleted

## 2017-10-23 NOTE — Telephone Encounter (Signed)
Returned her call requesting to reschedule her 72 hour ambulatory EEG she asked that I call her back tomorrow and I agreed.

## 2017-12-04 ENCOUNTER — Telehealth: Payer: Self-pay | Admitting: Neurology

## 2017-12-04 NOTE — Telephone Encounter (Signed)
Spoke with pt. Let her know that I received the form she dropped off.  Made her aware of [place] law stating that pt cannot drive for 6 month after LOC.  Pt states that none of her other doctors said that. Advised that I would fax form to [facility] when Dr. Karel JarvisAquino is finished with it.

## 2018-01-31 DIAGNOSIS — K219 Gastro-esophageal reflux disease without esophagitis: Secondary | ICD-10-CM

## 2018-01-31 DIAGNOSIS — E039 Hypothyroidism, unspecified: Secondary | ICD-10-CM | POA: Diagnosis not present

## 2018-01-31 DIAGNOSIS — M797 Fibromyalgia: Secondary | ICD-10-CM

## 2018-01-31 DIAGNOSIS — R739 Hyperglycemia, unspecified: Secondary | ICD-10-CM | POA: Diagnosis not present

## 2018-01-31 DIAGNOSIS — R200 Anesthesia of skin: Secondary | ICD-10-CM

## 2018-01-31 DIAGNOSIS — E871 Hypo-osmolality and hyponatremia: Secondary | ICD-10-CM | POA: Diagnosis not present

## 2018-01-31 DIAGNOSIS — R079 Chest pain, unspecified: Secondary | ICD-10-CM | POA: Diagnosis not present

## 2018-01-31 DIAGNOSIS — E8809 Other disorders of plasma-protein metabolism, not elsewhere classified: Secondary | ICD-10-CM | POA: Diagnosis not present

## 2018-03-04 ENCOUNTER — Telehealth: Payer: Self-pay | Admitting: Neurology

## 2018-03-04 NOTE — Telephone Encounter (Signed)
Spoke with pt.  She states that her DMV paperwork was filled out [facility].  She verified this with the DMV who told her that everything was received and in order.  Last week pt received [facility] from [facility] stating that her license was revoked due to us not faxing back the letter that was sent to pt from [facility] with [facility].  Pt requesting letter from Dr. Karel JarvisAquino to [facility] stating that she is OK to drive.

## 2018-03-04 NOTE — Telephone Encounter (Signed)
Patient called the after hours service regarding the DMV and having her License revoked? She said there was one paper missing. Please Call. Thanks

## 2018-03-06 NOTE — Telephone Encounter (Signed)
Received VM from pt stating that I had woken her from a dead sleep when I called earlier.  She states that she did receive a [facility] stating that her license was revoked, but did not state why.  Pt states that she then called the DMV and that was when she was told that her license was revoked due to our office faxing the neurological section instead of it being turned in by pt with the rest of the packet.

## 2018-03-06 NOTE — Telephone Encounter (Signed)
Pls bring paperwork so we can review. It looks like her other doctors have also filled out the forms.

## 2018-03-06 NOTE — Telephone Encounter (Signed)
Spoke with pt asking her to produce letter [facility].  Pt now states that DMV called her to inform her of her license revocation.  Will contact DMV

## 2018-04-12 DIAGNOSIS — E782 Mixed hyperlipidemia: Secondary | ICD-10-CM | POA: Insufficient documentation

## 2018-04-12 HISTORY — DX: Mixed hyperlipidemia: E78.2

## 2018-05-09 DIAGNOSIS — M329 Systemic lupus erythematosus, unspecified: Secondary | ICD-10-CM

## 2018-05-09 DIAGNOSIS — R55 Syncope and collapse: Secondary | ICD-10-CM | POA: Diagnosis not present

## 2018-05-09 DIAGNOSIS — E119 Type 2 diabetes mellitus without complications: Secondary | ICD-10-CM

## 2018-05-09 DIAGNOSIS — E039 Hypothyroidism, unspecified: Secondary | ICD-10-CM

## 2018-05-09 DIAGNOSIS — K219 Gastro-esophageal reflux disease without esophagitis: Secondary | ICD-10-CM | POA: Diagnosis not present

## 2018-05-09 DIAGNOSIS — M358 Other specified systemic involvement of connective tissue: Secondary | ICD-10-CM

## 2018-05-09 DIAGNOSIS — M3500 Sicca syndrome, unspecified: Secondary | ICD-10-CM

## 2018-05-09 DIAGNOSIS — M797 Fibromyalgia: Secondary | ICD-10-CM | POA: Diagnosis not present

## 2018-05-09 DIAGNOSIS — R739 Hyperglycemia, unspecified: Secondary | ICD-10-CM

## 2018-05-09 DIAGNOSIS — R079 Chest pain, unspecified: Secondary | ICD-10-CM | POA: Diagnosis not present

## 2018-05-10 DIAGNOSIS — M797 Fibromyalgia: Secondary | ICD-10-CM | POA: Diagnosis not present

## 2018-05-10 DIAGNOSIS — R55 Syncope and collapse: Secondary | ICD-10-CM | POA: Diagnosis not present

## 2018-05-10 DIAGNOSIS — R079 Chest pain, unspecified: Secondary | ICD-10-CM | POA: Diagnosis not present

## 2018-05-10 DIAGNOSIS — K219 Gastro-esophageal reflux disease without esophagitis: Secondary | ICD-10-CM | POA: Diagnosis not present

## 2018-11-22 DIAGNOSIS — E119 Type 2 diabetes mellitus without complications: Secondary | ICD-10-CM | POA: Diagnosis not present

## 2018-11-22 DIAGNOSIS — E039 Hypothyroidism, unspecified: Secondary | ICD-10-CM

## 2018-11-22 DIAGNOSIS — R079 Chest pain, unspecified: Secondary | ICD-10-CM | POA: Diagnosis not present

## 2018-11-22 DIAGNOSIS — K219 Gastro-esophageal reflux disease without esophagitis: Secondary | ICD-10-CM

## 2018-11-22 DIAGNOSIS — M3500 Sicca syndrome, unspecified: Secondary | ICD-10-CM

## 2018-11-22 DIAGNOSIS — M358 Other specified systemic involvement of connective tissue: Secondary | ICD-10-CM

## 2018-11-22 DIAGNOSIS — M797 Fibromyalgia: Secondary | ICD-10-CM

## 2018-11-22 DIAGNOSIS — M329 Systemic lupus erythematosus, unspecified: Secondary | ICD-10-CM

## 2018-11-22 DIAGNOSIS — S0093XA Contusion of unspecified part of head, initial encounter: Secondary | ICD-10-CM | POA: Diagnosis not present

## 2018-11-22 DIAGNOSIS — R55 Syncope and collapse: Secondary | ICD-10-CM | POA: Diagnosis not present

## 2019-12-06 ENCOUNTER — Encounter (HOSPITAL_COMMUNITY): Payer: Self-pay

## 2019-12-06 ENCOUNTER — Emergency Department (HOSPITAL_COMMUNITY)
Admission: EM | Admit: 2019-12-06 | Discharge: 2019-12-06 | Disposition: A | Discharge status: Home / Self Care | Payer: Medicare Other | Attending: Emergency Medicine | Admitting: Emergency Medicine

## 2019-12-06 ENCOUNTER — Other Ambulatory Visit: Payer: Self-pay

## 2019-12-06 DIAGNOSIS — Z794 Long term (current) use of insulin: Secondary | ICD-10-CM | POA: Diagnosis not present

## 2019-12-06 DIAGNOSIS — Z7982 Long term (current) use of aspirin: Secondary | ICD-10-CM | POA: Diagnosis not present

## 2019-12-06 DIAGNOSIS — R112 Nausea with vomiting, unspecified: Secondary | ICD-10-CM | POA: Diagnosis not present

## 2019-12-06 DIAGNOSIS — E860 Dehydration: Secondary | ICD-10-CM | POA: Diagnosis not present

## 2019-12-06 DIAGNOSIS — R739 Hyperglycemia, unspecified: Secondary | ICD-10-CM

## 2019-12-06 DIAGNOSIS — T675XXA Heat exhaustion, unspecified, initial encounter: Secondary | ICD-10-CM | POA: Diagnosis not present

## 2019-12-06 DIAGNOSIS — Z880 Allergy status to penicillin: Secondary | ICD-10-CM | POA: Insufficient documentation

## 2019-12-06 DIAGNOSIS — R531 Weakness: Secondary | ICD-10-CM | POA: Diagnosis not present

## 2019-12-06 DIAGNOSIS — E119 Type 2 diabetes mellitus without complications: Secondary | ICD-10-CM | POA: Insufficient documentation

## 2019-12-06 LAB — CBC
HCT: 36.1 % (ref 36.0–46.0)
Hemoglobin: 11.5 g/dL — ABNORMAL LOW (ref 12.0–15.0)
MCH: 25.8 pg — ABNORMAL LOW (ref 26.0–34.0)
MCHC: 31.9 g/dL (ref 30.0–36.0)
MCV: 80.9 fL (ref 80.0–100.0)
Platelets: 214 10*3/uL (ref 150–400)
RBC: 4.46 MIL/uL (ref 3.87–5.11)
RDW: 12.1 % (ref 11.5–15.5)
WBC: 6.5 10*3/uL (ref 4.0–10.5)
nRBC: 0 % (ref 0.0–0.2)

## 2019-12-06 LAB — COMPREHENSIVE METABOLIC PANEL
ALT: 15 U/L (ref 0–44)
AST: 22 U/L (ref 15–41)
Albumin: 4 g/dL (ref 3.5–5.0)
Alkaline Phosphatase: 80 U/L (ref 38–126)
Anion gap: 11 (ref 5–15)
BUN: 16 mg/dL (ref 6–20)
CO2: 22 mmol/L (ref 22–32)
Calcium: 9 mg/dL (ref 8.9–10.3)
Chloride: 102 mmol/L (ref 98–111)
Creatinine, Ser: 0.9 mg/dL (ref 0.44–1.00)
GFR calc Af Amer: >60 mL/min (ref >60)
GFR calc non Af Amer: >60 mL/min (ref >60)
Glucose, Bld: 389 mg/dL — ABNORMAL HIGH (ref 70–99)
Potassium: 3.9 mmol/L (ref 3.5–5.1)
Sodium: 135 mmol/L (ref 135–145)
Total Bilirubin: 0.3 mg/dL (ref 0.3–1.2)
Total Protein: 7 g/dL (ref 6.5–8.1)

## 2019-12-06 LAB — CBG MONITORING, ED
Glucose-Capillary: 255 mg/dL — ABNORMAL HIGH (ref 70–99)
Glucose-Capillary: 171 mg/dL — ABNORMAL HIGH (ref 70–99)

## 2019-12-06 MED ORDER — SODIUM CHLORIDE 0.9 % IV BOLUS
1000.0000 mL | Freq: Once | INTRAVENOUS | Status: AC
  Administered 2019-12-06: 1000 mL via INTRAVENOUS

## 2019-12-06 MED ORDER — ACETAMINOPHEN 500 MG PO TABS
1000.0000 mg | ORAL_TABLET | Freq: Once | ORAL | Status: AC
  Administered 2019-12-06: 1000 mg via ORAL
  Filled 2019-12-06: qty 2

## 2019-12-06 MED ORDER — ONDANSETRON 8 MG PO TBDP
8.0000 mg | ORAL_TABLET | Freq: Once | ORAL | Status: AC
  Administered 2019-12-06: 8 mg via ORAL
  Filled 2019-12-06: qty 1

## 2019-12-06 MED ORDER — SODIUM CHLORIDE 0.9 % IV BOLUS
1000.0000 mL | Freq: Once | INTRAVENOUS | Status: AC
Start: 2019-12-06 — End: 2019-12-06
  Administered 2019-12-06: 1000 mL via INTRAVENOUS

## 2019-12-06 MED ORDER — INSULIN ASPART 100 UNIT/ML ~~LOC~~ SOLN
10.0000 [IU] | Freq: Once | SUBCUTANEOUS | Status: AC
  Administered 2019-12-06: 10 [IU] via SUBCUTANEOUS
  Filled 2019-12-06: qty 0.1

## 2019-12-06 NOTE — ED Triage Notes (Signed)
Patient coming in the c/o heat exposure. Patient was given 600 ml NS and zofran 4 mg iv per ems.

## 2019-12-06 NOTE — ED Notes (Signed)
Patient offer fluids and a sandwich.

## 2019-12-06 NOTE — ED Notes (Signed)
Exported EKG that was completed at 1324 and printout given to Dr.Steinl.

## 2019-12-06 NOTE — ED Provider Notes (Signed)
[facility]-EMERGENCY DEPT Provider Note   CSN: [id number] Arrival date & time: 12/06/19  1302     History Chief Complaint  Patient presents with  . Dehydration  . Heat Exposure    Jamie Holmes is a 59 y.o. female.  Patient was working outside in sun/heat all day, and felt as if she was getting overheated. Pt notes had become sweaty, generally weak, had episode of nausea/vomiting (not bloody or bilious), and was noted to briefly pass out. Appeared mildly tremulous then, but no generalized tonic clonic seizure activity described, no post ictal period. EMS checked glucose which was normal, and gave ivf - states w iv ns bolus and being in color environment, pts symptoms markedly improved. Pt states felt fine/at baseline earlier today, no other acute symptoms.   The history is provided by the patient and the EMS personnel.       Past Medical History:  Diagnosis Date  . Diabetes mellitus without complication (HCC)   . Fibromyalgia   . Interstitial cystitis   . Liver disease    fatty liver; non-alcoholic  . Lupus (systemic lupus erythematosus) (HCC)   . Raynaud disease   . Sjogren's disease (HCC)   . Thyroid disease     Patient Active Problem List   Diagnosis Date Noted  . Tremor 07/26/2015  . Abnormality of gait 07/26/2015  . Memory difficulty 07/26/2015    Past Surgical History:  Procedure Laterality Date  . ABDOMINAL HYSTERECTOMY    . BILATERAL OOPHORECTOMY    . bladder tack    . BREAST SURGERY     lump, right breast  . heart stent  2018  . NOSE SURGERY       OB History   No obstetric history on file.     Family History  Problem Relation Age of Onset  . Stroke Mother   . Heart disease Father   . Stroke Father   . Heart attack Father   . Arthritis Father   . Diabetes Father   . Diabetes Brother   . Breast cancer Maternal Aunt        3 mat aunts with breast cancer  . Heart disease Maternal Grandmother   . Cancer Maternal  Grandmother   . Arthritis Maternal Grandmother   . Diabetes Maternal Grandmother   . Heart disease Maternal Grandfather   . Cancer Maternal Grandfather   . Arthritis Maternal Grandfather   . Heart disease Paternal Grandmother   . Arthritis Paternal Grandmother   . Stroke Paternal Grandmother   . Heart attack Paternal Grandmother   . Diabetes Paternal Grandmother   . Heart disease Paternal Grandfather   . Arthritis Paternal Grandfather   . Heart attack Paternal Grandfather   . Diabetes Paternal Grandfather     Social History   Tobacco Use  . Smoking status: Never Smoker  . Smokeless tobacco: Never Used  Substance Use Topics  . Alcohol use: No  . Drug use: No    Home Medications Prior to Admission medications   Medication Sig Start Date End Date Taking? Authorizing Provider  Insulin Glargine (BASAGLAR KWIKPEN) 100 UNIT/ML SOPN Inject 66 Units into the skin daily.  08/13/17  Yes [provider]  aspirin 81 MG chewable tablet Chew 81 mg by mouth. 08/26/16   [provider]  busPIRone (BUSPAR) 5 MG tablet Take 5 mg by mouth 2 (two) times daily.  07/30/17   [provider]  clopidogrel (PLAVIX) 75 MG tablet Take 75 mg by  mouth daily. 08/05/17   [provider]  escitalopram (LEXAPRO) 20 MG tablet Take by mouth.    [provider]  gabapentin (NEURONTIN) 300 MG capsule Take 300 mg by mouth 2 (two) times daily.  05/26/15   [provider]  HYDROcodone-acetaminophen (NORCO/VICODIN) 5-325 MG tablet Take 1 tablet by mouth every 6 (six) hours as needed for moderate pain.    [provider]  levothyroxine (SYNTHROID, LEVOTHROID) 150 MCG tablet Take 150 mcg by mouth daily before breakfast.    [provider]  methadone (DOLOPHINE) 10 MG tablet Take 20-40 mg by mouth 2 (two) times daily. Take 40mg  in the morning, and then 30 mg at night    [provider]  OZEMPIC, 1 MG/DOSE, 2 MG/1.5ML SOPN Inject 0.75 mLs as  directed once a week. 11/24/19   [provider]  pantoprazole (PROTONIX) 40 MG tablet Take 40 mg by mouth daily.    [provider]  polyethylene glycol powder (GLYCOLAX/MIRALAX) powder Take 17 g by mouth daily as needed. constipation 05/26/15   [provider]  traZODone (DESYREL) 100 MG tablet Take 300 mg by mouth at bedtime.    [provider]  VOLTAREN 1 % GEL Apply 1 application topically 2 (two) times daily as needed. pain 05/26/15   [provider]    Allergies    Atorvastatin, Ivp dye [iodinated diagnostic agents], Other, Penicillins, and Prednisone  Review of Systems   Review of Systems  Constitutional: Negative for chills and fever.  HENT: Negative for sore throat.   Eyes: Negative for visual disturbance.  Respiratory: Negative for cough and shortness of breath.   Cardiovascular: Negative for chest pain, palpitations and leg swelling.  Gastrointestinal: Positive for nausea and vomiting. Negative for abdominal pain, blood in stool and diarrhea.  Genitourinary: Negative for dysuria and flank pain.  Musculoskeletal: Negative for back pain and neck pain.  Skin: Negative for rash.  Neurological: Negative for weakness, numbness and headaches.  Hematological: Does not bruise/bleed easily.  Psychiatric/Behavioral: Negative for confusion.    Physical Exam Updated Vital Signs BP (!) 144/51 (BP Location: Right Arm)   Pulse 99   Temp 98.9 F (37.2 C) (Oral)   Resp 13   Ht 1.613 m (5' 3.5")   Wt 60.3 kg   SpO2 93%   BMI 23.19 kg/m   Physical Exam Vitals and nursing note reviewed.  Constitutional:      Appearance: Normal appearance. She is well-developed.  HENT:     Head: Atraumatic.     Nose: Nose normal.     Mouth/Throat:     Mouth: Mucous membranes are moist.  Eyes:     General: No scleral icterus.    Conjunctiva/sclera: Conjunctivae normal.     Pupils: Pupils are equal, round, and reactive to light.  Neck:     Trachea: No  tracheal deviation.  Cardiovascular:     Rate and Rhythm: Normal rate and regular rhythm.     Pulses: Normal pulses.     Heart sounds: Normal heart sounds. No murmur heard.  No friction rub. No gallop.   Pulmonary:     Effort: Pulmonary effort is normal. No respiratory distress.     Breath sounds: Normal breath sounds.  Abdominal:     General: Bowel sounds are normal. There is no distension.     Palpations: Abdomen is soft.     Tenderness: There is no abdominal tenderness. There is no guarding.  Genitourinary:    Comments: No cva  tenderness.  Musculoskeletal:        General: No swelling or tenderness.     Cervical back: Normal range of motion and neck supple. No rigidity. No muscular tenderness.     Comments: CTLS spine, non tender, aligned, no step off.   Skin:    General: Skin is warm and dry.     Findings: No rash.  Neurological:     Mental Status: She is alert.     Comments: Alert, speech normal. GCS 15. Motor/sens grossly intact bil. Steady gait.   Psychiatric:        Mood and Affect: Mood normal.     ED Results / Procedures / Treatments   Labs (all labs ordered are listed, but only abnormal results are displayed) Results for orders placed or performed during the hospital encounter of 12/06/19  CBC  Result Value Ref Range   WBC 6.5 4.0 - 10.5 K/uL   RBC 4.46 3.87 - 5.11 MIL/uL   Hemoglobin 11.5 (L) 12.0 - 15.0 g/dL   HCT 54.6 36 - 46 %   MCV 80.9 80.0 - 100.0 fL   MCH 25.8 (L) 26.0 - 34.0 pg   MCHC 31.9 30.0 - 36.0 g/dL   RDW 27.0 35.0 - 09.3 %   Platelets 214 150 - 400 K/uL   nRBC 0.0 0.0 - 0.2 %  Comprehensive metabolic panel  Result Value Ref Range   Sodium 135 135 - 145 mmol/L   Potassium 3.9 3.5 - 5.1 mmol/L   Chloride 102 98 - 111 mmol/L   CO2 22 22 - 32 mmol/L   Glucose, Bld 389 (H) 70 - 99 mg/dL   BUN 16 6 - 20 mg/dL   Creatinine, Ser 8.18 0.44 - 1.00 mg/dL   Calcium 9.0 8.9 - 29.9 mg/dL   Total Protein 7.0 6.5 - 8.1 g/dL   Albumin 4.0 3.5 - 5.0  g/dL   AST 22 15 - 41 U/L   ALT 15 0 - 44 U/L   Alkaline Phosphatase 80 38 - 126 U/L   Total Bilirubin 0.3 0.3 - 1.2 mg/dL   GFR calc non Af Amer >60 >60 mL/min   GFR calc Af Amer >60 >60 mL/min   Anion gap 11 5 - 15   ED ECG REPORT   Date: 12/06/2019  Rate: 118  Rhythm: sinus tachycardia  QRS Axis: normal  Intervals: normal  ST/T Wave abnormalities: nonspecific ST changes  Conduction Disutrbances:none  Narrative Interpretation:   Old EKG Reviewed: none available  I have personally reviewed the EKG tracing   Radiology No results found.  Procedures Procedures (including critical care time)  Medications Ordered in ED Medications  sodium chloride 0.9 % bolus 1,000 mL (0 mLs Intravenous Stopped 12/06/19 1508)  insulin aspart (novoLOG) injection 10 Units (10 Units Subcutaneous Given 12/06/19 1513)    ED Course  I have reviewed the triage vital signs and the nursing notes.  Pertinent labs & imaging results that were available during my care of the patient were reviewed by me and considered in my medical decision making (see chart for details).    MDM Rules/Calculators/A&P                          Labs sent. Iv ns bolus. Monitoring. Ecg. zofran iv. Additional ns bolus.   Reviewed nursing notes and prior charts for additional history.   Labs reviewed/interepreted by me - chem normal w exception glucose high, 389, hco2 and  an gap normal. Iv ns bolus. novolog sq (hx dm).  Po fluids. Ambulate.   Recheck pt, asymptomatic, eating/drinking. No new c/o. Glucose improved.  Tolerating po.  Patient requests d/c to home, states she feels fine, at baseline, no symptoms currently.   Patient appears stable for d/c.      Final Clinical Impression(s) / ED Diagnoses Final diagnoses:  None    Rx / DC Orders ED Discharge Orders    None       Cathren Laine, MD 12/06/19 1746

## 2019-12-06 NOTE — Discharge Instructions (Addendum)
It was our pleasure to provide your ER care today - we hope that you feel better.  Stay in cool environment for the rest of the day today. When in heat, make sure to stay well hydrated. If you begin to feel overheated, nausea, faint - go to cool area immediately, rest, and drink fluids.   Your blood sugar is high - drink plenty of water, continue diabetic meds, check sugars closely, and follow diabetic eating plan. Follow up with your doctor in the coming week.  Return to ER if worse, new symptoms, fevers, new or severe pain, persistent vomiting, trouble breathing, weak/faint, or other concern.

## 2020-07-07 ENCOUNTER — Emergency Department (HOSPITAL_COMMUNITY): Payer: Medicare Other

## 2020-07-07 ENCOUNTER — Other Ambulatory Visit: Payer: Self-pay

## 2020-07-07 ENCOUNTER — Emergency Department (HOSPITAL_COMMUNITY)
Admission: EM | Admit: 2020-07-07 | Discharge: 2020-07-07 | Disposition: A | Discharge status: Home / Self Care | Payer: Medicare Other | Attending: Emergency Medicine | Admitting: Emergency Medicine

## 2020-07-07 ENCOUNTER — Encounter (HOSPITAL_COMMUNITY): Payer: Self-pay

## 2020-07-07 DIAGNOSIS — M4322 Fusion of spine, cervical region: Secondary | ICD-10-CM

## 2020-07-07 DIAGNOSIS — M6281 Muscle weakness (generalized): Secondary | ICD-10-CM | POA: Diagnosis present

## 2020-07-07 DIAGNOSIS — R29700 NIHSS score 0: Secondary | ICD-10-CM

## 2020-07-07 DIAGNOSIS — Z7902 Long term (current) use of antithrombotics/antiplatelets: Secondary | ICD-10-CM | POA: Insufficient documentation

## 2020-07-07 DIAGNOSIS — R531 Weakness: Secondary | ICD-10-CM | POA: Diagnosis not present

## 2020-07-07 DIAGNOSIS — G8929 Other chronic pain: Secondary | ICD-10-CM

## 2020-07-07 DIAGNOSIS — I639 Cerebral infarction, unspecified: Secondary | ICD-10-CM | POA: Diagnosis not present

## 2020-07-07 DIAGNOSIS — Z794 Long term (current) use of insulin: Secondary | ICD-10-CM | POA: Insufficient documentation

## 2020-07-07 DIAGNOSIS — M329 Systemic lupus erythematosus, unspecified: Secondary | ICD-10-CM

## 2020-07-07 DIAGNOSIS — Z7982 Long term (current) use of aspirin: Secondary | ICD-10-CM

## 2020-07-07 DIAGNOSIS — Z955 Presence of coronary angioplasty implant and graft: Secondary | ICD-10-CM | POA: Insufficient documentation

## 2020-07-07 DIAGNOSIS — E119 Type 2 diabetes mellitus without complications: Secondary | ICD-10-CM | POA: Diagnosis not present

## 2020-07-07 DIAGNOSIS — F419 Anxiety disorder, unspecified: Secondary | ICD-10-CM

## 2020-07-07 DIAGNOSIS — M25511 Pain in right shoulder: Secondary | ICD-10-CM

## 2020-07-07 DIAGNOSIS — Z7901 Long term (current) use of anticoagulants: Secondary | ICD-10-CM

## 2020-07-07 LAB — DIFFERENTIAL
Abs Immature Granulocytes: 0.01 10*3/uL (ref 0.00–0.07)
Basophils Absolute: 0 10*3/uL (ref 0.0–0.1)
Basophils Relative: 0 %
Eosinophils Absolute: 0 10*3/uL (ref 0.0–0.5)
Eosinophils Relative: 0 %
Immature Granulocytes: 0 %
Lymphocytes Relative: 69 %
Lymphs Abs: 3.2 10*3/uL (ref 0.7–4.0)
Monocytes Absolute: 0.5 10*3/uL (ref 0.1–1.0)
Monocytes Relative: 10 %
Neutro Abs: 1 10*3/uL — ABNORMAL LOW (ref 1.7–7.7)
Neutrophils Relative %: 21 %

## 2020-07-07 LAB — CBC
HCT: 36.8 % (ref 36.0–46.0)
Hemoglobin: 11.9 g/dL — ABNORMAL LOW (ref 12.0–15.0)
MCH: 26.3 pg (ref 26.0–34.0)
MCHC: 32.3 g/dL (ref 30.0–36.0)
MCV: 81.2 fL (ref 80.0–100.0)
Platelets: 238 10*3/uL (ref 150–400)
RBC: 4.53 MIL/uL (ref 3.87–5.11)
RDW: 12.7 % (ref 11.5–15.5)
WBC: 4.7 10*3/uL (ref 4.0–10.5)
nRBC: 0 % (ref 0.0–0.2)

## 2020-07-07 LAB — COMPREHENSIVE METABOLIC PANEL
ALT: 19 U/L (ref 0–44)
AST: 22 U/L (ref 15–41)
Albumin: 3.8 g/dL (ref 3.5–5.0)
Alkaline Phosphatase: 57 U/L (ref 38–126)
Anion gap: 11 (ref 5–15)
BUN: 10 mg/dL (ref 6–20)
CO2: 25 mmol/L (ref 22–32)
Calcium: 9.5 mg/dL (ref 8.9–10.3)
Chloride: 102 mmol/L (ref 98–111)
Creatinine, Ser: 0.62 mg/dL (ref 0.44–1.00)
GFR, Estimated: >60 mL/min (ref >60)
Glucose, Bld: 85 mg/dL (ref 70–99)
Potassium: 3.8 mmol/L (ref 3.5–5.1)
Sodium: 138 mmol/L (ref 135–145)
Total Bilirubin: 0.4 mg/dL (ref 0.3–1.2)
Total Protein: 6.9 g/dL (ref 6.5–8.1)

## 2020-07-07 LAB — PROTIME-INR
INR: 1 (ref 0.8–1.2)
Prothrombin Time: 12.5 seconds (ref 11.4–15.2)

## 2020-07-07 LAB — APTT: aPTT: 32 seconds (ref 24–36)

## 2020-07-07 LAB — CBG MONITORING, ED: Glucose-Capillary: 93 mg/dL (ref 70–99)

## 2020-07-07 MED ORDER — SODIUM CHLORIDE 0.9% FLUSH: 3.0000 mL | Freq: Once | INTRAVENOUS | Status: DC

## 2020-07-07 NOTE — Discharge Planning (Signed)
RNCM consulted regarding transportation needs for pt.  RNCM provided [facility] Transportation services as pt has no transportation from hospital.  RNCM presented and explained [facility], nutritional and Release of Liability Form.  Pt signed waiver, therefore agreeing to written terms.

## 2020-07-07 NOTE — Consult Note (Signed)
Neurology Consult H&P  CC: left face numbness  History is obtained from: patient and chart review  HPI: Jamie Holmes is a 60 y.o. female PMHx as reviewed below has been having multiple TIAs for the past 2 weeks during which time her PCP has been following and ultimately advised her to call EMS for sensory disturbance described as numbness on the right side of her mouth which radiates up to her right eye then down to her right arm mostly in her fingertips then radiates down to her right leg mostly in the tips of her toes. Symptoms resolved after NCT but then complained of sore throat and numbness mostly on the right.  Chronic neck pain s/p cervical fusion. Anxiety, SOB right shoulder pain Denies changes in hearing/vision, recent illness, diarrhea, bowel bladder incontinence   LKW 9pm tpa given?: No, resolved IR Thrombectomy? No Modified Rankin Scale: 0-Completely asymptomatic and back to baseline post- stroke NIHSS: 0  ROS: A complete ROS was performed and is negative except as noted in the HPI.   Past Medical History:  Diagnosis Date  . Diabetes mellitus without complication (HCC)   . Fibromyalgia   . Interstitial cystitis   . Liver disease    fatty liver; non-alcoholic  . Lupus (systemic lupus erythematosus) (HCC)   . Raynaud disease   . Sjogren's disease (HCC)   . Thyroid disease      Family History  Problem Relation Age of Onset  . Stroke Mother   . Heart disease Father   . Stroke Father   . Heart attack Father   . Arthritis Father   . Diabetes Father   . Diabetes Brother   . Breast cancer Maternal Aunt        3 mat aunts with breast cancer  . Heart disease Maternal Grandmother   . Cancer Maternal Grandmother   . Arthritis Maternal Grandmother   . Diabetes Maternal Grandmother   . Heart disease Maternal Grandfather   . Cancer Maternal Grandfather   . Arthritis Maternal Grandfather   . Heart disease Paternal Grandmother   . Arthritis Paternal Grandmother   .  Stroke Paternal Grandmother   . Heart attack Paternal Grandmother   . Diabetes Paternal Grandmother   . Heart disease Paternal Grandfather   . Arthritis Paternal Grandfather   . Heart attack Paternal Grandfather   . Diabetes Paternal Grandfather     Social History:  reports that she has never smoked. She has never used smokeless tobacco. She reports that she does not drink alcohol and does not use drugs.   Prior to Admission medications   Medication Sig Start Date End Date Taking? Authorizing Provider  busPIRone (BUSPAR) 5 MG tablet Take 5 mg by mouth as needed (anxiety).  07/30/17   [provider]  clopidogrel (PLAVIX) 75 MG tablet Take 75 mg by mouth daily. 08/05/17   [provider]  DULoxetine (CYMBALTA) 60 MG capsule Take 60 mg by mouth daily. 11/18/19   [provider]  escitalopram (LEXAPRO) 20 MG tablet Take 20 mg by mouth daily.     [provider]  gabapentin (NEURONTIN) 300 MG capsule Take 300 mg by mouth See admin instructions. Take 2 capsules (600mg ) by mouth in the AM, 1 capsule (300mg ) by mouth in the afternoon, and 2 capsules (600mg ) by mouth at bedtime. 05/26/15   [provider]  HYDROcodone-acetaminophen (NORCO/VICODIN) 5-325 MG tablet Take 1 tablet by mouth in the morning, at noon, and at bedtime.     [provider]  Insulin Glargine (BASAGLAR KWIKPEN) 100 UNIT/ML SOPN Inject 66 Units into the skin daily.  08/13/17   [provider]  levothyroxine (SYNTHROID, LEVOTHROID) 150 MCG tablet Take 150 mcg by mouth daily before breakfast.    [provider]  Lidocaine 4 % PTCH Apply 1 patch topically 3 (three) times daily as needed (pain).    [provider]  methadone (DOLOPHINE) 10 MG tablet Take 10 mg by mouth in the morning, at noon, and at bedtime.     [provider]  OZEMPIC, 1 MG/DOSE, 2 MG/1.5ML SOPN Inject 0.75 mLs as directed once a week.  11/24/19   [provider]   pantoprazole (PROTONIX) 40 MG tablet Take 40 mg by mouth daily.    [provider]  polyethylene glycol powder (GLYCOLAX/MIRALAX) powder Take 17 g by mouth daily as needed for mild constipation or moderate constipation.  05/26/15   [provider]  RELISTOR 150 MG TABS Take 3 tablets by mouth as needed for constipation. 12/05/19   [provider]  SUMAtriptan (IMITREX) 100 MG tablet Take 100 mg by mouth as needed for migraine. 12/05/19   [provider]  terconazole (TERAZOL 7) 0.4 % vaginal cream Place 1 applicator vaginally as needed (irritation).  12/05/19   [provider]  topiramate (TOPAMAX) 50 MG tablet Take 50 mg by mouth 2 (two) times daily. 11/20/19   [provider]  traZODone (DESYREL) 100 MG tablet Take 300 mg by mouth at bedtime.    [provider]  VOLTAREN 1 % GEL Apply 1 application topically 2 (two) times daily as needed (pain).  05/26/15   [provider]    Exam: Current vital signs: BP 135/83   Pulse 82   Temp 98 F (36.7 C) (Oral)   Resp 10   SpO2 96%   Physical Exam  Constitutional: Appears well-developed and well-nourished.  Psych: Affect appropriate to situation Eyes: No scleral injection HENT: No OP obstruction. Head: Normocephalic.  Cardiovascular: Normal rate and regular rhythm.  Respiratory: Effort normal, symmetric excursions bilaterally, no audible wheezing. GI: Soft.  No distension. There is no tenderness.  Skin: WDI  Neuro: Mental Status: Patient is awake, alert, oriented to person, place, month, year, and situation. Patient is able to give a clear and coherent history. No signs of aphasia or neglect. Cranial Nerves: II: Visual Fields are full. Pupils are equal, round, and reactive to light. III,IV, VI: EOMI without ptosis or diploplia.  V: Facial sensation is symmetric to temperature VII: Facial movement is symmetric.  VIII: hearing is intact to voice X: Uvula elevates  symmetrically XI: Shoulder shrug is symmetric. XII: tongue is midline without atrophy or fasciculations.  Motor: Tone is normal. Bulk is normal. 5/5 strength was present in all four extremities. Sensory: Sensation is symmetric to light touch and temperature in the arms and legs. Deep Tendon Reflexes: 2+ and symmetric in the biceps and patellae. Plantars: Toes are downgoing bilaterally. Cerebellar: FNF and HKS are intact bilaterally.  I have reviewed labs in epic and the pertinent results are:   Ref. Range 07/07/2020 04:00  Chloride Latest Ref Range: 98 - 111 mmol/L 102  CO2 Latest Ref Range: 22 - 32 mmol/L 25  Glucose Latest Ref Range: 70 - 99 mg/dL 85  BUN Latest Ref Range: 6 - 20 mg/dL 10  Creatinine Latest Ref Range: 0.44 - 1.00 mg/dL 6.78  Calcium Latest Ref Range: 8.9 - 10.3 mg/dL 9.5  Anion gap Latest Ref Range:  5 - 15  11  Alkaline Phosphatase Latest Ref Range: 38 - 126 U/L 57  Albumin Latest Ref Range: 3.5 - 5.0 g/dL 3.8    I have reviewed the images obtained: NCT head showed Normal unenhanced CT scan of the brain.  Assessment: Jamie Holmes is a 60 y.o. female PMHx as above with complex constellation of symptoms which have resolved. The patient also states she has SLE and has had cervical vertebral fusion. She also describes paroxysmal weakness in her right hand. 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 She is back to baseline, has been on chronic clopidogrel and aspirin and her exam is reassuring. Advised patient to continue DAP and discuss CBT and referral to rheumatology with her PCP. She is amenable however she does not have money to get home and she lives far and her friends no longer want to help her as these issues have been persistent for years.  Please cancel code stroke  Electronically signed by:  Marisue Humble, MD Page: [phone] 07/07/2020, 5:32 AM

## 2020-07-07 NOTE — ED Triage Notes (Signed)
CODE STROKE: Called at 0306. Pt BIB EMS from home.  LKW 9pm. Pt woke up at 2am with right sided weakness and right arm drift. Pt reports she went to her PCP and started having TIA's x1 week.

## 2020-07-07 NOTE — ED Notes (Signed)
Pt expresses no concerns at the time of discharge. Neuro is still intact at the time of discharge.Pt is a&ox4. Pt was given cab [facility] for discharge.

## 2020-07-07 NOTE — Discharge Instructions (Addendum)
Follow-up with your primary doctor later this week, and return to the ER for change.

## 2020-07-07 NOTE — ED Notes (Signed)
Neuro at bedside

## 2020-07-07 NOTE — ED Provider Notes (Signed)
[facility] EMERGENCY DEPARTMENT Provider Note   CSN: [id number] Arrival date & time: 07/07/20  2094  An emergency department physician performed an initial assessment on this suspected stroke patient at 0225.  History Chief Complaint  Patient presents with  . Code Stroke    Jamie Holmes is a 60 y.o. female.  Patient is a 60 year old female with past medical history of diabetes, fibromyalgia, lupus.  She presents today with complaints of right arm and leg weakness.  This started at some point between 9 PM and her time of arrival here in the ER.  She went to bed at 9:00 feeling well, then woke up with the symptoms at approximately 230 this morning.  She was brought here as a code stroke.  She denies any headache.  She denies any fevers or chills.  She denies any visual disturbances.  He also tells me that she has had "6 TIAs this week".  She has been interacting with her primary doctor about the symptoms  The history is provided by the patient.       Past Medical History:  Diagnosis Date  . Diabetes mellitus without complication (HCC)   . Fibromyalgia   . Interstitial cystitis   . Liver disease    fatty liver; non-alcoholic  . Lupus (systemic lupus erythematosus) (HCC)   . Raynaud disease   . Sjogren's disease (HCC)   . Thyroid disease     Patient Active Problem List   Diagnosis Date Noted  . Tremor 07/26/2015  . Abnormality of gait 07/26/2015  . Memory difficulty 07/26/2015    Past Surgical History:  Procedure Laterality Date  . ABDOMINAL HYSTERECTOMY    . BILATERAL OOPHORECTOMY    . bladder tack    . BREAST SURGERY     lump, right breast  . heart stent  2018  . NOSE SURGERY       OB History   No obstetric history on file.     Family History  Problem Relation Age of Onset  . Stroke Mother   . Heart disease Father   . Stroke Father   . Heart attack Father   . Arthritis Father   . Diabetes Father   . Diabetes Brother   . Breast  cancer Maternal Aunt        3 mat aunts with breast cancer  . Heart disease Maternal Grandmother   . Cancer Maternal Grandmother   . Arthritis Maternal Grandmother   . Diabetes Maternal Grandmother   . Heart disease Maternal Grandfather   . Cancer Maternal Grandfather   . Arthritis Maternal Grandfather   . Heart disease Paternal Grandmother   . Arthritis Paternal Grandmother   . Stroke Paternal Grandmother   . Heart attack Paternal Grandmother   . Diabetes Paternal Grandmother   . Heart disease Paternal Grandfather   . Arthritis Paternal Grandfather   . Heart attack Paternal Grandfather   . Diabetes Paternal Grandfather     Social History   Tobacco Use  . Smoking status: Never Smoker  . Smokeless tobacco: Never Used  Substance Use Topics  . Alcohol use: No  . Drug use: No    Home Medications Prior to Admission medications   Medication Sig Start Date End Date Taking? Authorizing Provider  busPIRone (BUSPAR) 5 MG tablet Take 5 mg by mouth as needed (anxiety).  07/30/17   [provider]  clopidogrel (PLAVIX) 75 MG tablet Take 75 mg by mouth daily. 08/05/17   [provider]  DULoxetine (CYMBALTA) 60 MG capsule Take 60 mg by mouth daily. 11/18/19   [provider]  escitalopram (LEXAPRO) 20 MG tablet Take 20 mg by mouth daily.     [provider]  gabapentin (NEURONTIN) 300 MG capsule Take 300 mg by mouth See admin instructions. Take 2 capsules (600mg ) by mouth in the AM, 1 capsule (300mg ) by mouth in the afternoon, and 2 capsules (600mg ) by mouth at bedtime. 05/26/15   [provider]  HYDROcodone-acetaminophen (NORCO/VICODIN) 5-325 MG tablet Take 1 tablet by mouth in the morning, at noon, and at bedtime.     [provider]  Insulin Glargine (BASAGLAR KWIKPEN) 100 UNIT/ML SOPN Inject 66 Units into the skin daily.  08/13/17   [provider]  levothyroxine (SYNTHROID, LEVOTHROID) 150 MCG tablet Take 150 mcg by mouth daily  before breakfast.    [provider]  Lidocaine 4 % PTCH Apply 1 patch topically 3 (three) times daily as needed (pain).    [provider]  methadone (DOLOPHINE) 10 MG tablet Take 10 mg by mouth in the morning, at noon, and at bedtime.     [provider]  OZEMPIC, 1 MG/DOSE, 2 MG/1.5ML SOPN Inject 0.75 mLs as directed once a week.  11/24/19   [provider]  pantoprazole (PROTONIX) 40 MG tablet Take 40 mg by mouth daily.    [provider]  polyethylene glycol powder (GLYCOLAX/MIRALAX) powder Take 17 g by mouth daily as needed for mild constipation or moderate constipation.  05/26/15   [provider]  RELISTOR 150 MG TABS Take 3 tablets by mouth as needed for constipation. 12/05/19   [provider]  SUMAtriptan (IMITREX) 100 MG tablet Take 100 mg by mouth as needed for migraine. 12/05/19   [provider]  terconazole (TERAZOL 7) 0.4 % vaginal cream Place 1 applicator vaginally as needed (irritation).  12/05/19   [provider]  topiramate (TOPAMAX) 50 MG tablet Take 50 mg by mouth 2 (two) times daily. 11/20/19   [provider]  traZODone (DESYREL) 100 MG tablet Take 300 mg by mouth at bedtime.    [provider]  VOLTAREN 1 % GEL Apply 1 application topically 2 (two) times daily as needed (pain).  05/26/15   [provider]    Allergies    Atorvastatin, Ivp dye [iodinated diagnostic agents], Other, Penicillins, and Prednisone  Review of Systems   Review of Systems  All other systems reviewed and are negative.   Physical Exam Updated Vital Signs BP 123/72   Pulse 86   Temp 98 F (36.7 C) (Oral)   Resp 13   SpO2 98%   Physical Exam Vitals and nursing note reviewed.  Constitutional:      General: She is not in acute distress.    Appearance: She is well-developed and well-nourished. She is not diaphoretic.  HENT:     Head: Normocephalic and atraumatic.  Eyes:     Extraocular  Movements: Extraocular movements intact.     Pupils: Pupils are equal, round, and reactive to light.  Cardiovascular:     Rate and Rhythm: Normal rate and regular rhythm.     Heart sounds: No murmur heard. No friction rub. No gallop.   Pulmonary:     Effort: Pulmonary effort is normal. No respiratory distress.     Breath sounds: Normal breath sounds. No wheezing.  Abdominal:     General: Bowel sounds are normal. There is no distension.     Palpations:  Abdomen is soft.     Tenderness: There is no abdominal tenderness.  Musculoskeletal:        General: Normal range of motion.     Cervical back: Normal range of motion and neck supple.  Skin:    General: Skin is warm and dry.  Neurological:     General: No focal deficit present.     Mental Status: She is alert and oriented to person, place, and time.     Cranial Nerves: No cranial nerve deficit.     Sensory: No sensory deficit.     Motor: No weakness.     Comments: Patient with 4+ out of 5 strength in the right arm and right leg, but exam otherwise nonfocal.     ED Results / Procedures / Treatments   Labs (all labs ordered are listed, but only abnormal results are displayed) Labs Reviewed  CBC - Abnormal; Notable for the following components:      Result Value   Hemoglobin 11.9 (*)    All other components within normal limits  DIFFERENTIAL - Abnormal; Notable for the following components:   Neutro Abs 1.0 (*)    All other components within normal limits  PROTIME-INR  APTT  COMPREHENSIVE METABOLIC PANEL  I-STAT CHEM 8, ED  CBG MONITORING, ED  I-STAT BETA HCG BLOOD, ED ([facility], [facility], [facility] ONLY)    EKG EKG Interpretation  Date/Time:  Wednesday July 07 2020 03:51:59 EST Ventricular Rate:  86 PR Interval:    QRS Duration: 97 QT Interval:  353 QTC Calculation: 423 R Axis:   69 Text Interpretation: Sinus rhythm Normal ECG Confirmed by Geoffery LyonseLo, Indi [place] ([id number]) on 07/07/2020 4:52:17 AM   Radiology CT HEAD CODE STROKE WO  CONTRAST  Result Date: 07/07/2020 CLINICAL DATA:  Code stroke. EXAM: CT HEAD WITHOUT CONTRAST TECHNIQUE: Contiguous axial images were obtained from the base of the skull through the vertex without intravenous contrast. COMPARISON:  Previous MRI from 09/18/2015. FINDINGS: Brain: Cerebral volume within normal limits. No acute intracranial hemorrhage. No acute large vessel territory infarct. No mass lesion, midline shift or mass effect. No hydrocephalus or extra-axial fluid collection. Vascular: No hyperdense vessel. Skull: Scalp soft tissues and calvarium within normal limits. Sinuses/Orbits: Globes and orbital soft tissues demonstrate no acute finding. Paranasal sinuses and mastoid air cells are clear. Other: None. ASPECTS [facility]([place] Stroke Program Early CT Score) - Ganglionic level infarction (caudate, lentiform nuclei, internal capsule, insula, M1-M3 cortex): 7 - Supraganglionic infarction (M4-M6 cortex): 3 Total score (0-10 with 10 being normal): 10 IMPRESSION: 1. Negative head CT.  No acute intracranial abnormality. 2. ASPECTS is 10. These results were communicated to Dr. Thomasena Edisollins at 3:43 amon 1/19/2022by text page via the [facility] messaging system. Electronically Signed   By: Rise MuBenjamin  McClintock M.D.   On: 07/07/2020 03:44    Procedures Procedures (including critical care time)  Medications Ordered in ED Medications  sodium chloride flush (NS) 0.9 % injection 3 mL (0 mLs Intravenous Hold 07/07/20 0352)    ED Course  I have reviewed the triage vital signs and the nursing notes.  Pertinent labs & imaging results that were available during my care of the patient were reviewed by me and considered in my medical decision making (see chart for details).    MDM Rules/Calculators/A&P  Patient arrives here as a code stroke.  She was seen initially upon arrival, then sent to neurology for a stat CT of her head.  This was obtained laboratory studies were drawn.  Patient been evaluated  by Dr. Thomasena Edis from  neurology.  He does not feel as though patient's symptoms are stroke related and that she can be safely discharged.  See neurology consult.  Final Clinical Impression(s) / ED Diagnoses Final diagnoses:  None    Rx / DC Orders ED Discharge Orders    None       Geoffery Lyons, MD 07/07/20 [phone]

## 2020-07-15 NOTE — Progress Notes (Addendum)
Office Visit Note  Patient: Jamie Holmes             Date of Birth: 02-22-1961           MRN: [id number]             PCP: Dustin Flock, PA-C Referring: Dustin Flock, PA-C Visit Date: 07/16/2020   Subjective:  New Patient (Initial Visit) (Patient complains of overall joint pain. Patient has not seen a rheumatologist since the 67's. )   History of Present Illness: Jamie Holmes is a 60 y.o. female with a history of type 2 diabetes, asthma, GERD, TIA, CAD status post stent placement x3, peripheral vascular disease, lumbosacral radiculopathy, IBS, anxiety and depression here for evaluation and management of systemic lupus. She was originally diagnosed with lupus with what sounds like symptoms of malar rash, arthralgias, and sicca syndrome. She was treated at some point with prednisone which caused terrible adverse reactions and huge weight gain with striae. She took Plaquenil at some time does not recall exactly which dates but not since decades ago. 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 However earlier this month she right facial paresthesias and right arm and leg weakness with negative imaging workup felt to be consistent with TIA. Due to her history of multiple vascular problems and previous SLE not on treatment recommended to see for evaluation.  Currently describes intermittent parotid swelling, dry mouth and eyes, lymphadenopathy, raynaud's, arthralgias, and fatigue. No known history of blood clots or renal disease.  Previous labs 2017 showing positive ANA without lupus specific ENA profiles positive.  Activities of Daily Living:  Patient reports morning stiffness for 24 hours.   Patient Reports nocturnal pain.  Difficulty dressing/grooming: Reports Difficulty climbing stairs: Reports Difficulty  getting out of chair: Reports Difficulty using hands for taps, buttons, cutlery, and/or writing: Reports  Review of Systems  Constitutional: Positive for fatigue.  HENT: Positive for mouth dryness and nose dryness. Negative for mouth sores.   Eyes: Positive for visual disturbance and dryness. Negative for pain and itching.  Respiratory: Positive for shortness of breath and difficulty breathing. Negative for cough and hemoptysis.   Cardiovascular: Positive for chest pain and palpitations. Negative for swelling in legs/feet.  Gastrointestinal: Positive for abdominal pain, blood in stool and constipation. Negative for diarrhea.  Endocrine: Positive for increased urination.  Genitourinary: Positive for painful urination.  Musculoskeletal: Positive for arthralgias, joint pain, joint swelling, myalgias, muscle weakness, morning stiffness, muscle tenderness and myalgias.  Skin: Positive for color change, rash and redness.  Allergic/Immunologic: Positive for susceptible to infections.  Neurological: Positive for dizziness, numbness, headaches, memory loss and weakness.  Hematological: Positive for swollen glands.  Psychiatric/Behavioral: Positive for confusion and sleep disturbance.    PMFS History:  Patient Active Problem List   Diagnosis Date Noted  . Osteoporosis 07/16/2020  . GERD (gastroesophageal reflux disease) 07/16/2020  . Mixed hyperlipidemia 04/12/2018  . Stroke ([place]) 07/27/2017  . Diabetic polyneuropathy associated with type 2 diabetes mellitus ([place]) 03/22/2017  . Fibromyalgia 03/22/2017  . Chest pain, unspecified 01/21/2017  . Atherosclerotic heart disease of native coronary artery without angina pectoris 08/26/2016  . Tremor 07/26/2015  . Abnormality of gait 07/26/2015  . Memory difficulty 07/26/2015  . Constipation 08/11/2013  . Neck pain 03/19/2013  . Acquired unequal leg length 11/23/2012  . Chronic liver disease 11/23/2012  .  Systemic lupus erythematosus, unspecified  ([place]) 11/23/2012  . Type 2 diabetes mellitus with diabetic polyneuropathy, with long-term current use of insulin ([place]) 11/23/2012  . Insomnia 11/23/2012  . Acquired hypothyroidism 11/23/2012  . Asthma 08/21/2011  . Hemiplegia affecting dominant side ([place]) 08/21/2011  . Anxiety 06/19/2010  . Interstitial cystitis 06/20/1999  . Raynaud's disease 06/19/1993  . Migraines 06/20/1979    Past Medical History:  Diagnosis Date  . Avascular necrosis ([place])   . Diabetes mellitus without complication ([place])   . Fibromyalgia   . Hypothyroidism   . Interstitial cystitis   . Liver disease    fatty liver; non-alcoholic  . Lupus (systemic lupus erythematosus) ([place])   . Osteoarthritis   . Raynaud disease   . Sjogren's disease ([place])   . Thyroid disease     Family History  Problem Relation Age of Onset  . Stroke Mother   . Hypothyroidism Mother   . High Cholesterol Mother   . Heart disease Mother   . Heart disease Father   . Stroke Father   . Heart attack Father   . Arthritis Father   . Diabetes Father   . Diabetes Brother   . Hyperthyroidism Brother   . Heart disease Brother   . Breast cancer Maternal Aunt        3 mat aunts with breast cancer  . Heart disease Maternal Grandmother   . Cancer Maternal Grandmother   . Arthritis Maternal Grandmother   . Diabetes Maternal Grandmother   . Heart disease Maternal Grandfather   . Cancer Maternal Grandfather   . Arthritis Maternal Grandfather   . Heart disease Paternal Grandmother   . Arthritis Paternal Grandmother   . Stroke Paternal Grandmother   . Heart attack Paternal Grandmother   . Diabetes Paternal Grandmother   . Heart disease Paternal Grandfather   . Arthritis Paternal Grandfather   . Heart attack Paternal Grandfather   . Diabetes Paternal Grandfather    Past Surgical History:  Procedure Laterality Date  . ABDOMINAL HYSTERECTOMY    . BILATERAL OOPHORECTOMY    . bladder tack    . BREAST SURGERY     lump, right breast  .  heart stent  2018  . NOSE SURGERY     Social History   Social History Narrative   Patient drinks 2 sodas weekly.   Patient is right handed.       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 PFIZER(Purple Top)SARS-COV-2 Vaccination 08/28/2019, 09/18/2019, 06/15/2020     Objective: Vital Signs: BP 113/77 (BP Location: Left Arm, Patient Position: Sitting, Cuff Size: Normal)   Pulse 78   Ht $R'5\' 3"'CX$  (1.6 m)   Wt 138 lb 9.6 oz (62.9 kg)   BMI 24.55 kg/m    Physical Exam HENT:     Right Ear: External ear normal.     Left Ear: External ear normal.     Mouth/Throat:     Mouth: Mucous membranes are dry.     Comments: Flattening of tongue papillae Eyes:     Conjunctiva/sclera: Conjunctivae normal.  Cardiovascular:     Rate and Rhythm: Normal rate and regular rhythm.  Pulmonary:     Effort: Pulmonary effort is normal.     Breath sounds: Normal breath sounds.  Skin:    General: Skin is warm and dry.  Comments: Normal nailfold capilaroscopy  Neurological:     General: No focal deficit present.     Mental Status: She is alert.  Psychiatric:        Mood and Affect: Mood normal.     Musculoskeletal Exam:  Neck lateral and posterior tenderness to pressure and rotation, shoulders right okay left restricted ROM in all planes pain and guarding, elbows, wrists, fingers full ROM no swelling; diffuse paraspinal tenderness in upper and lower back, right hip internal rotation reduced and painful, bilateral patellofemoral crepitus right quadriceps atrophy, ankles full ROM, left 3rd MTP tenderness no swelling rest normal   Investigation: No additional findings.  Imaging: CT HEAD CODE STROKE WO CONTRAST  Result Date: 07/07/2020 CLINICAL DATA:  Code stroke. EXAM: CT HEAD WITHOUT CONTRAST TECHNIQUE: Contiguous axial images were obtained from the base of the  skull through the vertex without intravenous contrast. COMPARISON:  Previous MRI from 09/18/2015. FINDINGS: Brain: Cerebral volume within normal limits. No acute intracranial hemorrhage. No acute large vessel territory infarct. No mass lesion, midline shift or mass effect. No hydrocephalus or extra-axial fluid collection. Vascular: No hyperdense vessel. Skull: Scalp soft tissues and calvarium within normal limits. Sinuses/Orbits: Globes and orbital soft tissues demonstrate no acute finding. Paranasal sinuses and mastoid air cells are clear. Other: None. ASPECTS [facility] Stroke Program Early CT Score) - Ganglionic level infarction (caudate, lentiform nuclei, internal capsule, insula, M1-M3 cortex): 7 - Supraganglionic infarction (M4-M6 cortex): 3 Total score (0-10 with 10 being normal): 10 IMPRESSION: 1. Negative head CT.  No acute intracranial abnormality. 2. ASPECTS is 10. These results were communicated to Dr. Thomasena Edis at 3:43 amon 1/19/2022by text page via the [facility] messaging system. Electronically Signed   By: Rise Mu M.D.   On: 07/07/2020 03:44    Recent Labs: Lab Results  Component Value Date   WBC 4.7 07/07/2020   HGB 11.9 (L) 07/07/2020   PLT 238 07/07/2020   NA 138 07/07/2020   K 3.8 07/07/2020   CL 102 07/07/2020   CO2 25 07/07/2020   GLUCOSE 85 07/07/2020   BUN 10 07/07/2020   CREATININE 0.62 07/07/2020   BILITOT 0.4 07/07/2020   ALKPHOS 57 07/07/2020   AST 22 07/07/2020   ALT 19 07/07/2020   PROT 6.9 07/07/2020   ALBUMIN 3.8 07/07/2020   CALCIUM 9.5 07/07/2020   GFRAA >60 12/06/2019    Speciality Comments: No specialty comments available.  Procedures:  No procedures performed Allergies: Atorvastatin, Ivp dye [iodinated diagnostic agents], Other, Penicillins, and Prednisone   Assessment / Plan:     Visit Diagnoses: Systemic lupus erythematosus, unspecified SLE type, unspecified organ involvement status (HCC) - Plan: ANA, Anti-DNA antibody, double-stranded, C3  and C4, Beta-2 glycoprotein antibodies, Cardiolipin antibodies, IgG, IgM, IgA, Lupus Anticoagulant Eval w/Reflex, Sedimentation rate, CBC with Differential/Platelet  History of SLE with rashes, arthralgias, sicca symptoms, raynaud's unclear current disease activity state. Does not have history of renal disease or pericarditis. Unclear about previous APS testing but no clots history. Will check ESR, dsDNA, complements for activity monitoring also APS Abs with suspected TIA recently. Plan to start HCQ at f/u unless contraindication comes up.  Raynaud's disease without gangrene  Describes digit discoloration but less severe than in the past symptoms and never gangrene or other lesions associated. No specific symptom treatment started.  Fibromyalgia Interstitial cystitis Migraine without status migrainosus, not intractable, unspecified migraine   Does have diffuse myofascial pain throughout whole back especially. Migraines, fatigue, irritable bowels, interstitial cystitis all consistent with FMS which  is common alongside chronic pain which she has organic causes such as the AVN and advanced OA also lupus. Will assess degree of inflammatory disease and f/u plan to discuss nonpharmacologic treatments.  Orders: Orders Placed This Encounter  Procedures  . ANA  . Anti-DNA antibody, double-stranded  . C3 and C4  . Beta-2 glycoprotein antibodies  . Cardiolipin antibodies, IgG, IgM, IgA  . Lupus Anticoagulant Eval w/Reflex  . Sedimentation rate  . CBC with Differential/Platelet   No orders of the defined types were placed in this encounter.   Follow-Up Instructions: Return in about 2 weeks (around 07/30/2020) for New pt SLE f/u.   Collier Salina, MD  Note - This record has been created using [employer].  Chart creation errors have been sought, but may not always  have been located. Such creation errors do not reflect on  the standard of medical care.

## 2020-07-16 ENCOUNTER — Ambulatory Visit (INDEPENDENT_AMBULATORY_CARE_PROVIDER_SITE_OTHER): Payer: Medicare Other | Admitting: Internal Medicine

## 2020-07-16 ENCOUNTER — Other Ambulatory Visit: Payer: Self-pay

## 2020-07-16 ENCOUNTER — Encounter: Payer: Self-pay | Admitting: Internal Medicine

## 2020-07-16 VITALS — BP 113/77 | HR 78 | Ht 63.0 in | Wt 138.6 lb

## 2020-07-16 DIAGNOSIS — M810 Age-related osteoporosis without current pathological fracture: Secondary | ICD-10-CM | POA: Insufficient documentation

## 2020-07-16 DIAGNOSIS — M329 Systemic lupus erythematosus, unspecified: Secondary | ICD-10-CM | POA: Diagnosis not present

## 2020-07-16 DIAGNOSIS — M797 Fibromyalgia: Secondary | ICD-10-CM

## 2020-07-16 DIAGNOSIS — G43909 Migraine, unspecified, not intractable, without status migrainosus: Secondary | ICD-10-CM

## 2020-07-16 DIAGNOSIS — N3010 Interstitial cystitis (chronic) without hematuria: Secondary | ICD-10-CM | POA: Diagnosis not present

## 2020-07-16 DIAGNOSIS — I7300 Raynaud's syndrome without gangrene: Secondary | ICD-10-CM | POA: Diagnosis not present

## 2020-07-16 DIAGNOSIS — K219 Gastro-esophageal reflux disease without esophagitis: Secondary | ICD-10-CM

## 2020-07-16 HISTORY — DX: Gastro-esophageal reflux disease without esophagitis: K21.9

## 2020-07-16 HISTORY — DX: Age-related osteoporosis without current pathological fracture: M81.0

## 2020-07-16 NOTE — Patient Instructions (Addendum)
Erythrocyte Sedimentation Rate Test Why am I having this test? The erythrocyte sedimentation rate (ESR) test is used to help find illnesses related to:  Sudden (acute) or long-term (chronic) infections.  Inflammation.  The body's disease-fighting system attacking healthy cells (autoimmune diseases).  Cancer.  Tissue death. If you have symptoms that may be related to any of these illnesses, your health care provider may do an ESR test before doing more specific tests. If you have an inflammatory immune disease, such as rheumatoid arthritis, you may have this test to help monitor your therapy. What is being tested? This test measures how long it takes for your red blood cells (erythrocytes) to settle in a solution over a certain amount of time (sedimentation rate). When you have an infection or inflammation, your red blood cells clump together and settle faster. The sedimentation rate provides information about how much inflammation is present in the body. What kind of sample is taken? A blood sample is required for this test. It is usually collected by inserting a needle into a blood vessel.   How do I prepare for this test? Follow any instructions from your health care provider about changing or stopping your regular medicines. Tell a health care provider about:  Any allergies you have.  All medicines you are taking, including vitamins, herbs, eye drops, creams, and over-the-counter medicines.  Any blood disorders you have.  Any surgeries you have had.  Any medical conditions you have, such as thyroid or kidney disease.  Whether you are pregnant or may be pregnant. How are the results reported? Your results will be reported as a value that measures sedimentation rate in millimeters per hour (mm/hr). Your health care provider will compare your results to normal ranges that were established after testing a large group of people (reference values). Reference values may vary among  labs and hospitals. For this test, common reference values, which vary by age and gender, are:  Newborn: 0-2 mm/hr.  Child, up to puberty: 0-10 mm/hr.  Female: ? Under 50 years: 0-20 mm/hr. ? 50-85 years: 0-30 mm/hr. ? Over 85 years: 0-42 mm/hr.  Female: ? Under 50 years: 0-15 mm/hr. ? 50-85 years: 0-20 mm/hr. ? Over 85 years: 0-30 mm/hr. Certain conditions or medicines may cause ESR levels to be falsely lower or higher, such as:  Pregnancy.  Obesity.  Steroids, birth control pills, and blood thinners.  Thyroid or kidney disease. What do the results mean? Results that are within reference values are considered normal, meaning that the level of inflammation in your body is healthy. High ESR levels mean that there is inflammation in your body. You will have more tests to help make a diagnosis. Inflammation may result from many different conditions or injuries. Talk with your health care provider about what your results mean. Questions to ask your health care provider Ask your health care provider, or the department that is doing the test:  When will my results be ready?  How will I get my results?  What are my treatment options?  What other tests do I need?  What are my next steps? Summary  The erythrocyte sedimentation rate (ESR) test is used to help find illnesses associated with sudden (acute) or long-term (chronic) infections, inflammation, autoimmune diseases, cancer, or tissue death.  If you have symptoms that may be related to any of these illnesses, your health care provider may do an ESR test before doing more specific tests. If you have an inflammatory immune disease, such as  rheumatoid arthritis, you may have this test to help monitor your therapy.  This test measures how long it takes for your red blood cells (erythrocytes) to settle in a solution over a certain amount of time (sedimentation rate). This provides information about how much inflammation is present  in the body.    Complement Assay Test Why am I having this test? Complement refers to a group of proteins that are part of the body's disease-fighting system (immune system). A complement assay test provides information about some or all of these proteins. You may have this test:  To diagnose a lack, or deficiency, of certain complement proteins. Deficiencies can be passed from parent to child (inherited).  To monitor an infection or autoimmune disease.  If you have unexplained inflammation or swelling (edema).  If you have bacterial infections again and again. What is being tested? This test can be used to measure:  Total complement. This is the total number of protein complements in your blood.  The number of each kind of complement in your blood. The nine main kinds of complement are labeled C1 through C9. Some of these complements, such as C3 and C4, are especially important and have many functions in the body. Depending on why you are having the test, your health care provider may test your total complement or only some individual complements, such as C3 and C4. The total complement assay test may be done before individual complements are tested. What kind of sample is taken? A blood sample is required for this test. It is usually collected by inserting a needle into a blood vessel.   Tell a health care provider about:  Any allergies you have.  All medicines you are taking, including vitamins, herbs, eye drops, creams, and over-the-counter medicines.  Any blood disorders you have.  Any surgeries you have had.  Any medical conditions you have.  Whether you are pregnant or may be pregnant. How are the results reported? Your results will be reported as a value that tells you how much complement is in your blood. This will be given as units per milliliter of blood (units/mL) or as milligrams per deciliter of blood (mg/dL). Your results may be reported as total complement, or as  individual complements, or both. Your health care provider will compare your results to normal ranges that were established after testing a large group of people (reference ranges). Reference ranges may vary among labs and hospitals. For this test, reference ranges for some of the most commonly measured complement assays may be:  Total complement: 30-75 units/mL.  C2: 1-4 mg/dL.  C3: 75-175 mg/dL.  C4: 22-45 units/mL. What do the results mean? Results within reference ranges are considered normal, which means you have a normal amount of complement in your blood. Results that are higher than the reference ranges may be caused by:  Inflammatory disease.  Heart attack.  Cancer. Complement deficiencies, or results lower than the reference ranges, may be caused by:  Certain inherited conditions.  Autoimmune disease.  Certain liver diseases.  Malnutrition.  Certain types of anemia that result in breakdown of red blood cells (hemolytic anemia). Talk with your health care provider about what your results mean. Questions to ask your health care provider Ask your health care provider, or the department that is doing the test:  When will my results be ready?  How will I get my results?  What are my treatment options?  What other tests do I need?  What are my  next steps? Summary  Complement refers to a group of proteins that are part of the body's disease-fighting system (immune system). A complement assay test can provide information about some or all of these proteins.  You may have a complement assay test to help diagnose a complement deficiency, and to monitor some infections or autoimmune disease.  Talk with your health care provider about what your results mean.     Hydroxychloroquine tablets What is this medicine? HYDROXYCHLOROQUINE (hye drox ee KLOR oh kwin) is used to treat rheumatoid arthritis and systemic lupus erythematosus. It is also used to treat  malaria. This medicine may be used for other purposes; ask your health care provider or pharmacist if you have questions. COMMON BRAND NAME(S): Plaquenil, Quineprox What should I tell my health care provider before I take this medicine? They need to know if you have any of these conditions:  diabetes  eye disease, vision problems  G6PD deficiency  heart disease  history of irregular heartbeat  if you often drink alcohol  kidney disease  liver disease  porphyria  psoriasis  an unusual or allergic reaction to chloroquine, hydroxychloroquine, other medicines, foods, dyes, or preservatives  pregnant or trying to get pregnant  breast-feeding How should I use this medicine? Take this medicine by mouth with a glass of water. Take it as directed on the prescription label. Do not cut, crush or chew this medicine. Swallow the tablets whole. Take it with food. Do not take it more than directed. Take all of this medicine unless your health care provider tells you to stop it early. Keep taking it even if you think you are better. Take products with antacids in them at a different time of day than this medicine. Take this medicine 4 hours before or 4 hours after antacids. Talk to your health care provider if you have questions. Talk to your pediatrician regarding the use of this medicine in children. While this drug may be prescribed for selected conditions, precautions do apply. Overdosage: If you think you have taken too much of this medicine contact a poison control center or emergency room at once. NOTE: This medicine is only for you. Do not share this medicine with others. What if I miss a dose? If you miss a dose, take it as soon as you can. If it is almost time for your next dose, take only that dose. Do not take double or extra doses. What may interact with this medicine? Do not take this medicine with any of the following  medications:  cisapride  dronedarone  pimozide  thioridazine This medicine may also interact with the following medications:  ampicillin  antacids  cimetidine  cyclosporine  digoxin  kaolin  medicines for diabetes, like insulin, glipizide, glyburide  medicines for seizures like carbamazepine, phenobarbital, phenytoin  mefloquine  methotrexate  other medicines that prolong the QT interval (cause an abnormal heart rhythm)  praziquantel This list may not describe all possible interactions. Give your health care provider a list of all the medicines, herbs, non-prescription drugs, or dietary supplements you use. Also tell them if you smoke, drink alcohol, or use illegal drugs. Some items may interact with your medicine. What should I watch for while using this medicine? Visit your health care provider for regular checks on your progress. Tell your health care provider if your symptoms do not start to get better or if they get worse. You may need blood work done while you are taking this medicine. If you take other  medicines that can affect heart rhythm, you may need more testing. Talk to your health care provider if you have questions. Your vision may be tested before and during use of this medicine. Tell your health care provider right away if you have any change in your eyesight. This medicine may cause serious skin reactions. They can happen weeks to months after starting the medicine. Contact your health care provider right away if you notice fevers or flu-like symptoms with a rash. The rash may be red or purple and then turn into blisters or peeling of the skin. Or, you might notice a red rash with swelling of the face, lips or lymph nodes in your neck or under your arms. If you or your family notice any changes in your behavior, such as new or worsening depression, thoughts of harming yourself, anxiety, or other unusual or disturbing thoughts, or memory loss, call your health  care provider right away. What side effects may I notice from receiving this medicine? Side effects that you should report to your doctor or health care professional as soon as possible:  allergic reactions (skin rash, itching or hives; swelling of the face, lips, or tongue)  changes in vision  decreased hearing, ringing in the ears  heartbeat rhythm changes (trouble breathing; chest pain; dizziness; fast, irregular heartbeat; feeling faint or lightheaded, falls)  liver injury (dark yellow or brown urine; general ill feeling or flu-like symptoms; loss of appetite, right upper belly pain; unusually weak or tired, yellowing of the eyes or skin)  low blood sugar (feeling anxious; confusion; dizziness; increased hunger; unusually weak or tired; increased sweating; shakiness; cold, clammy skin; irritable; headache; blurred vision; fast heartbeat; loss of consciousness)  low red blood cell counts (trouble breathing; feeling faint; lightheaded, falls; unusually weak or tired)  muscle weakness  pain, tingling, numbness in the hands or feet  rash, fever, and swollen lymph nodes  redness, blistering, peeling or loosening of the skin, including inside the mouth  suicidal thoughts, mood changes  uncontrollable head, mouth, neck, arm, or leg movements  unusual bruising or bleeding Side effects that usually do not require medical attention (report to your doctor or health care professional if they continue or are bothersome):  diarrhea  hair loss  irritable This list may not describe all possible side effects. Call your doctor for medical advice about side effects. You may report side effects to FDA at 1-800-FDA-1088. Where should I keep my medicine? Keep out of the reach of children and pets. Store at room temperature up to 30 degrees C (86 degrees F). Protect from light. Get rid of any unused medicine after the expiration date. To get rid of medicines that are no longer needed or have  expired:  Take the medicine to a medicine take-back program. Check with your pharmacy or law enforcement to find a location.  If you cannot return the medicine, check the label or package insert to see if the medicine should be thrown out in the garbage or flushed down the toilet. If you are not sure, ask your health care provider. If it is safe to put it in the trash, empty the medicine out of the container. Mix the medicine with cat litter, dirt, coffee grounds, or other unwanted substance. Seal the mixture in a bag or container. Put it in the trash.

## 2020-07-19 LAB — CARDIOLIPIN ANTIBODIES, IGG, IGM, IGA
Anticardiolipin IgA: 3.7 APL-U/mL
Anticardiolipin IgG: 2 GPL-U/mL
Anticardiolipin IgM: <2 MPL-U/mL

## 2020-07-19 LAB — CBC WITH DIFFERENTIAL/PLATELET
Absolute Monocytes: 463 cells/uL (ref 200–950)
Basophils Absolute: 21 cells/uL (ref 0–200)
Basophils Relative: 0.5 %
Eosinophils Absolute: 8 cells/uL — ABNORMAL LOW (ref 15–500)
Eosinophils Relative: 0.2 %
HCT: 36.3 % (ref 35.0–45.0)
Hemoglobin: 11.9 g/dL (ref 11.7–15.5)
Lymphs Abs: 2206 cells/uL (ref 850–3900)
MCH: 25.9 pg — ABNORMAL LOW (ref 27.0–33.0)
MCHC: 32.8 g/dL (ref 32.0–36.0)
MCV: 78.9 fL — ABNORMAL LOW (ref 80.0–100.0)
MPV: 10.1 fL (ref 7.5–12.5)
Monocytes Relative: 11.3 %
Neutro Abs: 1402 cells/uL — ABNORMAL LOW (ref 1500–7800)
Neutrophils Relative %: 34.2 %
Platelets: 224 10*3/uL (ref 140–400)
RBC: 4.6 10*6/uL (ref 3.80–5.10)
RDW: 13 % (ref 11.0–15.0)
Total Lymphocyte: 53.8 %
WBC: 4.1 10*3/uL (ref 3.8–10.8)

## 2020-07-19 LAB — C3 AND C4
C3 Complement: 168 mg/dL (ref 83–193)
C4 Complement: 32 mg/dL (ref 15–57)

## 2020-07-19 LAB — ANTI-DNA ANTIBODY, DOUBLE-STRANDED: ds DNA Ab: <1 IU/mL

## 2020-07-19 LAB — ANA: Anti Nuclear Antibody (ANA): POSITIVE — AB

## 2020-07-19 LAB — LUPUS ANTICOAGULANT EVAL W/ REFLEX
PTT-LA Screen: 41 s — ABNORMAL HIGH (ref <=40)
dRVVT: 30 s (ref <=45)

## 2020-07-19 LAB — ANTI-NUCLEAR AB-TITER (ANA TITER): ANA Titer 1: 1:320 {titer} — ABNORMAL HIGH

## 2020-07-19 LAB — BETA-2 GLYCOPROTEIN ANTIBODIES
Beta-2 Glyco 1 IgA: 3.4 U/mL
Beta-2 Glyco 1 IgM: <2 U/mL
Beta-2 Glyco I IgG: 15.8 U/mL

## 2020-07-19 LAB — SEDIMENTATION RATE: Sed Rate: 33 mm/h — ABNORMAL HIGH (ref 0–30)

## 2020-07-19 LAB — RFLX HEXAGONAL PHASE CONFIRM: Hexagonal Phase Conf: NEGATIVE

## 2020-08-01 NOTE — Progress Notes (Signed)
Office Visit Note  Patient: Jamie Holmes             Date of Birth: Oct 27, 1960           MRN: [id number]             PCP: Dustin Flock, PA-C Referring: Dustin Flock, PA-C Visit Date: 08/02/2020   Subjective:  Follow-up (Patient complains of right lower quadrant abdominal pain (sharp in nature and occurring intermittently every hour or so), nausea, loss of appetite, headache since last yesterday. In regards to patient's joint pain, no changes. )   History of Present Illness: Jamie Holmes is a 60 y.o. female with a history of type 2 diabetes, asthma, GERD, TIA, CAD status post stent placement x3, peripheral vascular disease, lumbosacral radiculopathy, IBS, anxiety and depression here for follow up of systemic lupus. At last visit labs were checked demonstrating positive ANA without any other specific antibody titers. She continues to have similar pain as her last visit worst in the bilateral knees with her AVN but also hand pain, swelling, and stiffness. Only new symptom is since 2 days ago RLQ abdominal pain that feels like a stabbing sensation and greatly decreased appetite. She denies blood in urine or stools, and has passed stools since the onset.   Labs reviewed from 07/16/20 ANA 1:320 homogenous dsDNa neg Complement C3 C4 wnl ACA neg B2GP neg LA confirmatory test neg ESR 33 CBC MCV 78.9, neutrophils 1402, eos 8, else unremarkable   Previous lupus manifestations Arthralgias Sicca syndrome Lymphadenopathy Raynaud's without gangrene Fatigue  Previous lupus treatments HCQ  Previous labs in 2017 showing positive ANA without lupus specific ENA profiles positive.     Review of Systems  Constitutional: Positive for appetite change and fatigue. Negative for fever.  HENT: Positive for mouth dryness and nose dryness. Negative for mouth sores.   Eyes: Positive for itching and dryness. Negative for pain and visual disturbance.  Respiratory: Positive for shortness of  breath. Negative for cough, hemoptysis and difficulty breathing.   Cardiovascular: Negative for chest pain, palpitations and swelling in legs/feet.  Gastrointestinal: Positive for abdominal pain and nausea. Negative for blood in stool, constipation and diarrhea.  Endocrine: Negative for increased urination.  Genitourinary: Negative for painful urination.  Musculoskeletal: Positive for arthralgias, joint pain, joint swelling, myalgias, muscle weakness, morning stiffness, muscle tenderness and myalgias.  Skin: Positive for color change. Negative for rash and redness.  Allergic/Immunologic: Positive for susceptible to infections.  Neurological: Positive for numbness, headaches and memory loss. Negative for dizziness and weakness.  Hematological: Negative for swollen glands.  Psychiatric/Behavioral: Positive for sleep disturbance. Negative for confusion.    PMFS History:  Patient Active Problem List   Diagnosis Date Noted  . Right lower quadrant abdominal pain 08/02/2020  . Osteoporosis 07/16/2020  . GERD (gastroesophageal reflux disease) 07/16/2020  . Mixed hyperlipidemia 04/12/2018  . Stroke ([place]) 07/27/2017  . Diabetic polyneuropathy associated with type 2 diabetes mellitus ([place]) 03/22/2017  . Fibromyalgia 03/22/2017  . Chest pain, unspecified 01/21/2017  . Atherosclerotic heart disease of native coronary artery without angina pectoris 08/26/2016  . Tremor 07/26/2015  . Abnormality of gait 07/26/2015  . Memory difficulty 07/26/2015  . Constipation 08/11/2013  . Neck pain 03/19/2013  . Acquired unequal leg length 11/23/2012  . Chronic liver disease 11/23/2012  . Systemic lupus erythematosus, unspecified ([place]) 11/23/2012  . Type 2 diabetes mellitus with diabetic polyneuropathy, with long-term current use of insulin ([place]) 11/23/2012  . Insomnia 11/23/2012  . Acquired hypothyroidism 11/23/2012  .  Asthma 08/21/2011  . Hemiplegia affecting dominant side ([place]) 08/21/2011  . Anxiety  06/19/2010  . Interstitial cystitis 06/20/1999  . Raynaud's disease 06/19/1993  . Migraines 06/20/1979    Past Medical History:  Diagnosis Date  . Avascular necrosis ([place])   . Diabetes mellitus without complication ([place])   . Fibromyalgia   . Hypothyroidism   . Interstitial cystitis   . Liver disease    fatty liver; non-alcoholic  . Lupus (systemic lupus erythematosus) ([place])   . Osteoarthritis   . Raynaud disease   . Sjogren's disease ([place])   . Thyroid disease     Family History  Problem Relation Age of Onset  . Stroke Mother   . Hypothyroidism Mother   . High Cholesterol Mother   . Heart disease Mother   . Heart disease Father   . Stroke Father   . Heart attack Father   . Arthritis Father   . Diabetes Father   . Diabetes Brother   . Hyperthyroidism Brother   . Heart disease Brother   . Breast cancer Maternal Aunt        3 mat aunts with breast cancer  . Heart disease Maternal Grandmother   . Cancer Maternal Grandmother   . Arthritis Maternal Grandmother   . Diabetes Maternal Grandmother   . Heart disease Maternal Grandfather   . Cancer Maternal Grandfather   . Arthritis Maternal Grandfather   . Heart disease Paternal Grandmother   . Arthritis Paternal Grandmother   . Stroke Paternal Grandmother   . Heart attack Paternal Grandmother   . Diabetes Paternal Grandmother   . Heart disease Paternal Grandfather   . Arthritis Paternal Grandfather   . Heart attack Paternal Grandfather   . Diabetes Paternal Grandfather    Past Surgical History:  Procedure Laterality Date  . ABDOMINAL HYSTERECTOMY    . BILATERAL OOPHORECTOMY    . bladder tack    . BREAST SURGERY     lump, right breast  . heart stent  2018  . NOSE SURGERY     Social History   Social History Narrative   Patient drinks 2 sodas weekly.   Patient is right handed.       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 PFIZER(Purple Top)SARS-COV-2 Vaccination 08/28/2019, 09/18/2019, 06/14/2020     Objective: Vital Signs: BP 125/67 (BP Location: Left Arm, Patient Position: Sitting, Cuff Size: Normal)   Pulse 89   Temp 98.2 F (36.8 C)   Ht 5' 3" (1.6 m)   Wt 136 lb 9.6 oz (62 kg)   BMI 24.20 kg/m    Physical Exam Eyes:     Conjunctiva/sclera: Conjunctivae normal.  Cardiovascular:     Rate and Rhythm: Normal rate and regular rhythm.  Pulmonary:     Effort: Pulmonary effort is normal.     Breath sounds: Normal breath sounds.  Abdominal:     General: Abdomen is flat.     Comments: Diffusely decrease bowel sounds Ventral hernia present RLQ tenderness to palpation without rebound tenderness No CVA tenderness, no midline pelvic pain  Skin:    General: Skin is warm and dry.     Findings: No rash.  Neurological:     Mental Status: She is alert.  Psychiatric:        Mood and Affect: Mood normal.  Musculoskeletal Exam:  Elbows full ROM no tenderness or swelling Wrists full ROM no tenderness or swelling Fingers left 3rd MCP and PIP swelling and tenderness 4th MCP tenderness normal ROM, right hand no synovitis Knees full ROM no tenderness or swelling  Investigation: No additional findings.  Imaging: CT HEAD CODE STROKE WO CONTRAST  Result Date: 07/07/2020 CLINICAL DATA:  Code stroke. EXAM: CT HEAD WITHOUT CONTRAST TECHNIQUE: Contiguous axial images were obtained from the base of the skull through the vertex without intravenous contrast. COMPARISON:  Previous MRI from 09/18/2015. FINDINGS: Brain: Cerebral volume within normal limits. No acute intracranial hemorrhage. No acute large vessel territory infarct. No mass lesion, midline shift or mass effect. No hydrocephalus or extra-axial fluid collection. Vascular: No hyperdense vessel. Skull: Scalp soft tissues and calvarium within normal limits. Sinuses/Orbits: Globes and orbital  soft tissues demonstrate no acute finding. Paranasal sinuses and mastoid air cells are clear. Other: None. ASPECTS [facility] Stroke Program Early CT Score) - Ganglionic level infarction (caudate, lentiform nuclei, internal capsule, insula, M1-M3 cortex): 7 - Supraganglionic infarction (M4-M6 cortex): 3 Total score (0-10 with 10 being normal): 10 IMPRESSION: 1. Negative head CT.  No acute intracranial abnormality. 2. ASPECTS is 10. These results were communicated to Dr. Theda Sers at 3:43 amon 1/19/2022by text page via the [facility] messaging system. Electronically Signed   By: Jeannine Boga M.D.   On: 07/07/2020 03:44    Recent Labs: Lab Results  Component Value Date   WBC 4.1 07/16/2020   HGB 11.9 07/16/2020   PLT 224 07/16/2020   NA 138 07/07/2020   K 3.8 07/07/2020   CL 102 07/07/2020   CO2 25 07/07/2020   GLUCOSE 85 07/07/2020   BUN 10 07/07/2020   CREATININE 0.62 07/07/2020   BILITOT 0.4 07/07/2020   ALKPHOS 57 07/07/2020   AST 22 07/07/2020   ALT 19 07/07/2020   PROT 6.9 07/07/2020   ALBUMIN 3.8 07/07/2020   CALCIUM 9.5 07/07/2020   GFRAA >60 12/06/2019    Speciality Comments: No specialty comments available.  Procedures:  No procedures performed Allergies: Atorvastatin, Ivp dye [iodinated diagnostic agents], Other, Penicillins, and Prednisone   Assessment / Plan:     Visit Diagnoses: Systemic lupus erythematosus, unspecified SLE type, unspecified organ involvement status ([place]) - Plan: hydroxychloroquine (PLAQUENIL) 200 MG tablet  Continued symptoms of daily joint pain with some swelling visible in hand joints. Recommend starting HCQ for this. Previously tolerated the medicine some years ago apparently. She is on methadone and lexapro currently so will recommend checking an EKG if staying on the medicine for risk of QT prolonging medicines, but she was normal on 06/27/20 tracing. $RemoveBeforeD'300mg'oUVxyadnfGVwfN$  PO daily for $RemoveB'5mg'towZwclQ$ /kg dose and f/u in 8 wks for response.  Raynaud's disease without  gangrene  No pitting or lesions present. This may benefit from starting systemic therapy no indication for specific treatment at this time.  Right lower quadrant abdominal pain  Abdominal pain is very low suggests pelvic pain issue vs bowel adhesions vs appendicitis. She is s/p hysterectomy and oophorectomies and has had previous bladder tack. Do not suspect UTI or pyelo and stone less typical. Recommend she see urgent care visit or ED if pain worsens or starts to experience fever or unable to tolerate PO.  Orders: No orders of the defined types were placed in this encounter.  Meds ordered this encounter  Medications  . hydroxychloroquine (PLAQUENIL) 200 MG tablet    Sig: Take 1.5 tablets (300 mg total) by mouth daily.    Dispense:  120 tablet    Refill:  0     Follow-Up Instructions: Return in about 2 months (around 09/30/2020) for SLE new HCQ f/u.   Collier Salina, MD  Note - This record has been created using [employer].  Chart creation errors have been sought, but may not always  have been located. Such creation errors do not reflect on  the standard of medical care.

## 2020-08-02 ENCOUNTER — Telehealth: Payer: Self-pay

## 2020-08-02 ENCOUNTER — Encounter: Payer: Self-pay | Admitting: Internal Medicine

## 2020-08-02 ENCOUNTER — Ambulatory Visit (INDEPENDENT_AMBULATORY_CARE_PROVIDER_SITE_OTHER): Payer: Medicare Other | Admitting: Internal Medicine

## 2020-08-02 ENCOUNTER — Other Ambulatory Visit: Payer: Self-pay | Admitting: Internal Medicine

## 2020-08-02 ENCOUNTER — Other Ambulatory Visit: Payer: Self-pay

## 2020-08-02 VITALS — BP 125/67 | HR 89 | Temp 98.2°F | Ht 63.0 in | Wt 136.6 lb

## 2020-08-02 DIAGNOSIS — R1031 Right lower quadrant pain: Secondary | ICD-10-CM

## 2020-08-02 DIAGNOSIS — I7300 Raynaud's syndrome without gangrene: Secondary | ICD-10-CM

## 2020-08-02 DIAGNOSIS — M329 Systemic lupus erythematosus, unspecified: Secondary | ICD-10-CM

## 2020-08-02 HISTORY — DX: Right lower quadrant pain: R10.31

## 2020-08-02 MED ORDER — HYDROXYCHLOROQUINE SULFATE 200 MG PO TABS: 300.0000 mg | ORAL_TABLET | Freq: Every day | ORAL | 0 refills | Status: DC

## 2020-08-02 NOTE — Telephone Encounter (Signed)
Please advise 

## 2020-08-02 NOTE — Telephone Encounter (Signed)
Whitney, pharmacist from [facility] called stating they received prescription of Hydroxychloroquine.  They wanted to make sure Dr. Dimple Casey was aware that patient is also prescribed Escitalopram and Methadone. Please return call to #[phone] regarding possible drug interactions.

## 2020-08-02 NOTE — Patient Instructions (Signed)
Hydroxychloroquine tablets What is this medicine? HYDROXYCHLOROQUINE (hye drox ee KLOR oh k[place]) is used to treat rheumatoid arthritis and systemic lupus erythematosus. It is also used to treat malaria. This medicine may be used for other purposes; ask your health care provider or pharmacist if you have questions. COMMON BRAND NAME(S): Plaquenil, Quineprox What should I tell my health care provider before I take this medicine? They need to know if you have any of these conditions:  diabetes  eye disease, vision problems  G6PD deficiency  heart disease  history of irregular heartbeat  if you often drink alcohol  kidney disease  liver disease  porphyria  psoriasis  an unusual or allergic reaction to chloroquine, hydroxychloroquine, other medicines, foods, dyes, or preservatives  pregnant or trying to get pregnant  breast-feeding How should I use this medicine? Take this medicine by mouth with a glass of water. Take it as directed on the prescription label. Do not cut, crush or chew this medicine. Swallow the tablets whole. Take it with food. Do not take it more than directed. Take all of this medicine unless your health care provider tells you to stop it early. Keep taking it even if you think you are better. Take products with antacids in them at a different time of day than this medicine. Take this medicine 4 hours before or 4 hours after antacids. Talk to your health care provider if you have questions. Talk to your pediatrician regarding the use of this medicine in children. While this drug may be prescribed for selected conditions, precautions do apply. Overdosage: If you think you have taken too much of this medicine contact a poison control center or emergency room at once. NOTE: This medicine is only for you. Do not share this medicine with others. What if I miss a dose? If you miss a dose, take it as soon as you can. If it is almost time for your next dose, take only  that dose. Do not take double or extra doses. What may interact with this medicine? Do not take this medicine with any of the following medications:  cisapride  dronedarone  pimozide  thioridazine This medicine may also interact with the following medications:  ampicillin  antacids  cimetidine  cyclosporine  digoxin  kaolin  medicines for diabetes, like insulin, glipizide, glyburide  medicines for seizures like carbamazepine, phenobarbital, phenytoin  mefloquine  methotrexate  other medicines that prolong the QT interval (cause an abnormal heart rhythm)  praziquantel This list may not describe all possible interactions. Give your health care provider a list of all the medicines, herbs, non-prescription drugs, or dietary supplements you use. Also tell them if you smoke, drink alcohol, or use illegal drugs. Some items may interact with your medicine. What should I watch for while using this medicine? Visit your health care provider for regular checks on your progress. Tell your health care provider if your symptoms do not start to get better or if they get worse. You may need blood work done while you are taking this medicine. If you take other medicines that can affect heart rhythm, you may need more testing. Talk to your health care provider if you have questions. Your vision may be tested before and during use of this medicine. Tell your health care provider right away if you have any change in your eyesight. This medicine may cause serious skin reactions. They can happen weeks to months after starting the medicine. Contact your health care provider right away if you   notice fevers or flu-like symptoms with a rash. The rash may be red or purple and then turn into blisters or peeling of the skin. Or, you might notice a red rash with swelling of the face, lips or lymph nodes in your neck or under your arms. If you or your family notice any changes in your behavior, such as new  or worsening depression, thoughts of harming yourself, anxiety, or other unusual or disturbing thoughts, or memory loss, call your health care provider right away. What side effects may I notice from receiving this medicine? Side effects that you should report to your doctor or health care professional as soon as possible:  allergic reactions (skin rash, itching or hives; swelling of the face, lips, or tongue)  changes in vision  decreased hearing, ringing in the ears  heartbeat rhythm changes (trouble breathing; chest pain; dizziness; fast, irregular heartbeat; feeling faint or lightheaded, falls)  liver injury (dark yellow or brown urine; general ill feeling or flu-like symptoms; loss of appetite, right upper belly pain; unusually weak or tired, yellowing of the eyes or skin)  low blood sugar (feeling anxious; confusion; dizziness; increased hunger; unusually weak or tired; increased sweating; shakiness; cold, clammy skin; irritable; headache; blurred vision; fast heartbeat; loss of consciousness)  low red blood cell counts (trouble breathing; feeling faint; lightheaded, falls; unusually weak or tired)  muscle weakness  pain, tingling, numbness in the hands or feet  rash, fever, and swollen lymph nodes  redness, blistering, peeling or loosening of the skin, including inside the mouth  suicidal thoughts, mood changes  uncontrollable head, mouth, neck, arm, or leg movements  unusual bruising or bleeding Side effects that usually do not require medical attention (report to your doctor or health care professional if they continue or are bothersome):  diarrhea  hair loss  irritable This list may not describe all possible side effects. Call your doctor for medical advice about side effects. You may report side effects to FDA at 1-800-FDA-1088. Where should I keep my medicine? Keep out of the reach of children and pets. Store at room temperature up to 30 degrees C (86 degrees F).  Protect from light. Get rid of any unused medicine after the expiration date. To get rid of medicines that are no longer needed or have expired:  Take the medicine to a medicine take-back program. Check with your pharmacy or law enforcement to find a location.  If you cannot return the medicine, check the label or package insert to see if the medicine should be thrown out in the garbage or flushed down the toilet. If you are not sure, ask your health care provider. If it is safe to put it in the trash, empty the medicine out of the container. Mix the medicine with cat litter, dirt, coffee grounds, or other unwanted substance. Seal the mixture in a bag or container. Put it in the trash. NOTE: This sheet is a summary. It may not cover all possible information. If you have questions about this medicine, talk to your doctor, pharmacist, or health care provider.  2021 Elsevier/Gold Standard (2019-11-24 15:07:49)  

## 2020-09-27 ENCOUNTER — Ambulatory Visit: Payer: Medicare Other | Admitting: Internal Medicine

## 2020-09-29 ENCOUNTER — Ambulatory Visit: Payer: Medicare Other | Admitting: Internal Medicine

## 2020-10-22 ENCOUNTER — Other Ambulatory Visit: Payer: Self-pay | Admitting: Internal Medicine

## 2020-10-22 DIAGNOSIS — M329 Systemic lupus erythematosus, unspecified: Secondary | ICD-10-CM

## 2020-11-09 ENCOUNTER — Ambulatory Visit: Payer: Medicare Other | Admitting: Internal Medicine

## 2020-12-01 ENCOUNTER — Ambulatory Visit (INDEPENDENT_AMBULATORY_CARE_PROVIDER_SITE_OTHER): Payer: Medicare Other | Admitting: Internal Medicine

## 2020-12-01 ENCOUNTER — Other Ambulatory Visit: Payer: Self-pay

## 2020-12-01 ENCOUNTER — Encounter: Payer: Self-pay | Admitting: Internal Medicine

## 2020-12-01 VITALS — BP 111/72 | HR 80 | Ht 63.0 in | Wt 133.8 lb

## 2020-12-01 DIAGNOSIS — M797 Fibromyalgia: Secondary | ICD-10-CM | POA: Diagnosis not present

## 2020-12-01 DIAGNOSIS — M329 Systemic lupus erythematosus, unspecified: Secondary | ICD-10-CM | POA: Diagnosis not present

## 2020-12-01 DIAGNOSIS — M25572 Pain in left ankle and joints of left foot: Secondary | ICD-10-CM

## 2020-12-01 DIAGNOSIS — I7300 Raynaud's syndrome without gangrene: Secondary | ICD-10-CM

## 2020-12-01 HISTORY — DX: Pain in left ankle and joints of left foot: M25.572

## 2020-12-01 MED ORDER — METHOTREXATE SODIUM CHEMO INJECTION 50 MG/2ML: 15.0000 mg | INTRAMUSCULAR | 0 refills | Status: DC

## 2020-12-01 MED ORDER — "TUBERCULIN SYRINGE 26G X 3/8"" 1 ML MISC": 0 refills | Status: DC

## 2020-12-01 MED ORDER — FOLIC ACID 1 MG PO TABS: 1.0000 mg | ORAL_TABLET | Freq: Every day | ORAL | 0 refills | Status: DC

## 2020-12-01 NOTE — Progress Notes (Signed)
Office Visit Note  Patient: Jamie Holmes             Date of Birth: May 19, 1961           MRN: [id number]             PCP: Dustin Flock, PA-C Referring: Dustin Flock, PA-C Visit Date: 12/01/2020   Subjective:  Follow-up (Patient is no longer taking PLQ due to side effects. While taking PLQ patient noticed increased lupus symptoms. )   History of Present Illness: Jamie Holmes is a 60 y.o. female here for follow up for systemic lupus after recommending restart of hydroxychloroquine 300 mg PO daily for joint inflammation.  After starting the Plaquenil she did not notice any improvement in arthralgia symptoms and in fact felt worse all over.  She felt body aches and skin problems were exacerbated while taking the medication.  She discontinued this and feels her symptoms are just as bad now as before our last visit.   Labs reviewed from 07/16/20 ANA 1:320 homogenous dsDNa neg Complement C3 C4 wnl ACA neg B2GP neg LA confirmatory test neg ESR 33 CBC MCV 78.9, neutrophils 1402, eos 8, else unremarkable     Previous lupus manifestations Arthralgias Sicca syndrome Lymphadenopathy Raynaud's without gangrene Fatigue CAD Bilateral knee AVN   Previous lupus treatments HCQ   Review of Systems  Constitutional:  Positive for fatigue.  HENT:  Positive for mouth sores, mouth dryness and nose dryness.   Eyes:  Positive for pain, itching, visual disturbance and dryness.  Respiratory:  Positive for cough, shortness of breath and difficulty breathing. Negative for hemoptysis.   Cardiovascular:  Positive for chest pain, palpitations and swelling in legs/feet.  Gastrointestinal:  Positive for blood in stool and constipation. Negative for abdominal pain and diarrhea.  Endocrine: Positive for increased urination.  Genitourinary:  Positive for painful urination and involuntary urination.  Musculoskeletal:  Positive for joint pain, joint pain, joint swelling, myalgias, muscle weakness,  morning stiffness, muscle tenderness and myalgias.  Skin:  Positive for redness. Negative for color change and rash.  Allergic/Immunologic: Negative for susceptible to infections.  Neurological:  Positive for dizziness, numbness, headaches, memory loss and weakness.  Hematological:  Positive for swollen glands.  Psychiatric/Behavioral:  Positive for confusion. Negative for sleep disturbance.    PMFS History:  Patient Active Problem List   Diagnosis Date Noted   Pain in left ankle and joints of left foot 12/01/2020   Right lower quadrant abdominal pain 08/02/2020   Osteoporosis 07/16/2020   GERD (gastroesophageal reflux disease) 07/16/2020   Mixed hyperlipidemia 04/12/2018   Stroke ([place]) 07/27/2017   Diabetic polyneuropathy associated with type 2 diabetes mellitus ([place]) 03/22/2017   Fibromyalgia 03/22/2017   Chest pain, unspecified 01/21/2017   Atherosclerotic heart disease of native coronary artery without angina pectoris 08/26/2016   Tremor 07/26/2015   Abnormality of gait 07/26/2015   Memory difficulty 07/26/2015   Constipation 08/11/2013   Neck pain 03/19/2013   Acquired unequal leg length 11/23/2012   Chronic liver disease 11/23/2012   Systemic lupus erythematosus, unspecified ([place]) 11/23/2012   Type 2 diabetes mellitus with diabetic polyneuropathy, with long-term current use of insulin ([place]) 11/23/2012   Insomnia 11/23/2012   Acquired hypothyroidism 11/23/2012   Asthma 08/21/2011   Hemiplegia affecting dominant side ([place]) 08/21/2011   Anxiety 06/19/2010   Interstitial cystitis 06/20/1999   Raynaud's disease 06/19/1993   Migraines 06/20/1979    Past Medical History:  Diagnosis Date   Avascular necrosis ([place])  Diabetes mellitus without complication ([place])    Fibromyalgia    Hypothyroidism    Interstitial cystitis    Liver disease    fatty liver; non-alcoholic   Lupus (systemic lupus erythematosus) ([place])    Osteoarthritis    Raynaud disease    Sjogren's disease  ([place])    Thyroid disease     Family History  Problem Relation Age of Onset   Stroke Mother    Hypothyroidism Mother    High Cholesterol Mother    Heart disease Mother    Heart disease Father    Stroke Father    Heart attack Father    Arthritis Father    Diabetes Father    Diabetes Brother    Hyperthyroidism Brother    Heart disease Brother    Breast cancer Maternal Aunt        3 mat aunts with breast cancer   Heart disease Maternal Grandmother    Cancer Maternal Grandmother    Arthritis Maternal Grandmother    Diabetes Maternal Grandmother    Heart disease Maternal Grandfather    Cancer Maternal Grandfather    Arthritis Maternal Grandfather    Heart disease Paternal Grandmother    Arthritis Paternal Grandmother    Stroke Paternal Grandmother    Heart attack Paternal Grandmother    Diabetes Paternal Grandmother    Heart disease Paternal Grandfather    Arthritis Paternal Grandfather    Heart attack Paternal Grandfather    Diabetes Paternal Grandfather    Past Surgical History:  Procedure Laterality Date   ABDOMINAL HYSTERECTOMY     BILATERAL OOPHORECTOMY     bladder tack     BREAST SURGERY     lump, right breast   heart stent  2018   NOSE SURGERY     Social History   Social History Narrative   Patient drinks 2 sodas weekly.   Patient is right handed.       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     Findings: No rash.  Neurological:     General: No focal deficit present.     Mental Status: She is  alert.     Musculoskeletal Exam: Elbows full ROM no tenderness or swelling Wrists full ROM no tenderness or swelling Fingers full ROM, left 3rd digit stiffness and tenderness, swelling chronic soft tissue hypertrophy, right fingers normal appearance Knees full ROM no tenderness or swelling    Investigation: No additional findings.  Imaging: No results found.  Recent Labs: Lab Results  Component Value Date   WBC 4.1 07/16/2020   HGB 11.9 07/16/2020   PLT 224 07/16/2020   NA 138 07/07/2020   K 3.8 07/07/2020   CL 102 07/07/2020   CO2 25 07/07/2020   GLUCOSE 85 07/07/2020   BUN 10 07/07/2020   CREATININE 0.62 07/07/2020   BILITOT 0.4 07/07/2020   ALKPHOS 57 07/07/2020   AST 22 07/07/2020   ALT 19 07/07/2020  PROT 6.9 07/07/2020   ALBUMIN 3.8 07/07/2020   CALCIUM 9.5 07/07/2020   GFRAA >60 12/06/2019    Speciality Comments: No specialty comments available.  Procedures:  No procedures performed Allergies: Atorvastatin, Ivp dye [iodinated diagnostic agents], Other, Penicillins, and Prednisone   Assessment / Plan:     Visit Diagnoses: Systemic lupus erythematosus, unspecified SLE type, unspecified organ involvement status ([place]) - Plan: TUBERCULIN SYR 1CC/26GX3/8" 26G X 3/8" 1 ML MISC, folic acid (FOLVITE) 1 MG tablet, methotrexate 50 MG/2ML injection  History of lupus with particular symptoms of chronic joint arthralgias and bilateral knee avascular necrosis unusually she did not tolerate hydroxychloroquine well at all and claims it may have increased symptoms.  Discussed alternative treatment options with a particular focus on joint and skin disease.  Recommend trial of starting methotrexate 15 mg subcu weekly and folic acid 1 mg daily.  Recommending subcu administration due to her chronic history of GI issues some of which related to decreased motility from chronic pain management also significant ventral hernia.  We will need to follow-up for repeat labs with new  methotrexate start.  Fibromyalgia  Chronic pain systemic symptoms could be associated with her generalized feeling worse such as gastrointestinal problems.  However suspect more likely related to functional issue.  Raynaud's disease without gangrene  No new pitting scarring or finger lesions, planning for lupus treatment as above no additional medication for this problem seems stable.   Orders: No orders of the defined types were placed in this encounter.  Meds ordered this encounter  Medications   TUBERCULIN SYR 1CC/26GX3/8" 26G X 3/8" 1 ML MISC    Sig: Use once weekly for subcutaneous methotrexate injection    Dispense:  25 each    Refill:  0   folic acid (FOLVITE) 1 MG tablet    Sig: Take 1 tablet (1 mg total) by mouth daily.    Dispense:  90 tablet    Refill:  0   methotrexate 50 MG/2ML injection    Sig: Inject 0.6 mLs (15 mg total) into the skin once a week.    Dispense:  4 mL    Refill:  0    Recommend okay to take low dose methotrexate along with PPI treatment     Follow-Up Instructions: Return in about 3 weeks (around 12/22/2020) for MTX new start f/u ~3wks.   Collier Salina, MD  Note - This record has been created using [employer].  Chart creation errors have been sought, but may not always  have been located. Such creation errors do not reflect on  the standard of medical care.

## 2020-12-14 ENCOUNTER — Ambulatory Visit (INDEPENDENT_AMBULATORY_CARE_PROVIDER_SITE_OTHER): Payer: Medicare Other

## 2020-12-14 ENCOUNTER — Ambulatory Visit (INDEPENDENT_AMBULATORY_CARE_PROVIDER_SITE_OTHER): Payer: Medicare Other | Admitting: Sports Medicine

## 2020-12-14 ENCOUNTER — Encounter: Payer: Self-pay | Admitting: Sports Medicine

## 2020-12-14 ENCOUNTER — Other Ambulatory Visit: Payer: Self-pay

## 2020-12-14 DIAGNOSIS — M79672 Pain in left foot: Secondary | ICD-10-CM | POA: Diagnosis not present

## 2020-12-14 DIAGNOSIS — M779 Enthesopathy, unspecified: Secondary | ICD-10-CM

## 2020-12-14 DIAGNOSIS — M84375A Stress fracture, left foot, initial encounter for fracture: Secondary | ICD-10-CM

## 2020-12-14 DIAGNOSIS — M7742 Metatarsalgia, left foot: Secondary | ICD-10-CM

## 2020-12-14 NOTE — Patient Instructions (Signed)
Use topical voltaren gel to left foot twice a week and then if no better switch to using topical lidoderm patch for your left foot pain Wear surgical shoe anytime you are walking/standing to try to help with your pain

## 2020-12-14 NOTE — Progress Notes (Signed)
Subjective: Jamie Holmes is a 60 y.o. female patient who presents to office for evaluation of left foot pain. Patient complains of progressive pain especially over the last 2 months dropped laptop on left foot and didn't originally get treated but had xrays 3 weeks ago at [facility] [place] after reporting pain to PCP. Patient does have history of significant pain, RA, Lupus, and Diabetes a1c 12.2 with FBS ranges 120-210.   Patient Active Problem List   Diagnosis Date Noted   Pain in left ankle and joints of left foot 12/01/2020   Right lower quadrant abdominal pain 08/02/2020   Osteoporosis 07/16/2020   GERD (gastroesophageal reflux disease) 07/16/2020   Mixed hyperlipidemia 04/12/2018   Stroke ([place]) 07/27/2017   Diabetic polyneuropathy associated with type 2 diabetes mellitus ([place]) 03/22/2017   Fibromyalgia 03/22/2017   Chest pain, unspecified 01/21/2017   Atherosclerotic heart disease of native coronary artery without angina pectoris 08/26/2016   Tremor 07/26/2015   Abnormality of gait 07/26/2015   Memory difficulty 07/26/2015   Constipation 08/11/2013   Neck pain 03/19/2013   Acquired unequal leg length 11/23/2012   Chronic liver disease 11/23/2012   Systemic lupus erythematosus, unspecified ([place]) 11/23/2012   Type 2 diabetes mellitus with diabetic polyneuropathy, with long-term current use of insulin ([place]) 11/23/2012   Insomnia 11/23/2012   Acquired hypothyroidism 11/23/2012   Asthma 08/21/2011   Hemiplegia affecting dominant side ([place]) 08/21/2011   Anxiety 06/19/2010   Interstitial cystitis 06/20/1999   Raynaud's disease 06/19/1993   Migraines 06/20/1979    Current Outpatient Medications on File Prior to Visit  Medication Sig Dispense Refill   ACCU-CHEK AVIVA PLUS test strip 1 each 3 (three) times daily.     Accu-Chek Softclix Lancets lancets 1 each 3 (three) times daily.     aspirin 81 MG EC tablet Take 1 tablet by mouth daily.     azelastine (ASTELIN) 0.1 % nasal spray Place  into both nostrils.     azithromycin (ZITHROMAX) 500 MG tablet Take 500 mg by mouth as needed. (Patient not taking: No sig reported)     Blood Glucose Monitoring Suppl (ACCU-CHEK GUIDE ME) w/Device KIT      busPIRone (BUSPAR) 5 MG tablet Take 5 mg by mouth as needed (anxiety).  (Patient not taking: No sig reported)     clopidogrel (PLAVIX) 75 MG tablet Take 75 mg by mouth daily.  3   diclofenac Sodium (VOLTAREN) 1 % GEL Apply 4 g topically 4 (four) times daily.     DULoxetine (CYMBALTA) 60 MG capsule Take 60 mg by mouth daily. (Patient not taking: No sig reported)     escitalopram (LEXAPRO) 20 MG tablet Take 20 mg by mouth daily.      folic acid (FOLVITE) 1 MG tablet Take 1 tablet (1 mg total) by mouth daily. 90 tablet 0   furosemide (LASIX) 20 MG tablet Take 20 mg by mouth 2 (two) times daily as needed.     gabapentin (NEURONTIN) 300 MG capsule Take 300 mg by mouth See admin instructions. Take 2 capsules (62m) by mouth in the AM, 1 capsule (3071m by mouth in the afternoon, and 2 capsules (60054mby mouth at bedtime.  0   HYDROcodone-acetaminophen (NORCO/VICODIN) 5-325 MG tablet Take 1 tablet by mouth in the morning, at noon, and at bedtime.     Insulin Glargine (BASAGLAR KWIKPEN) 100 UNIT/ML SOPN Inject 66 Units into the skin daily.  (Patient not taking: No sig reported)  3   levothyroxine (SYNTHROID) 137 MCG tablet Take  137 mcg by mouth daily.     levothyroxine (SYNTHROID, LEVOTHROID) 150 MCG tablet Take 150 mcg by mouth daily before breakfast.     lidocaine (LIDODERM) 5 % 1 patch daily.     Lidocaine 4 % PTCH Apply 1 patch topically 3 (three) times daily as needed (pain).     LINZESS 145 MCG CAPS capsule Take 290 mcg by mouth daily.     methadone (DOLOPHINE) 10 MG tablet Take 10 mg by mouth in the morning, at noon, and at bedtime.      methotrexate 50 MG/2ML injection Inject 0.6 mLs (15 mg total) into the skin once a week. 4 mL 0   nitroGLYCERIN (NITROSTAT) 0.4 MG SL tablet PLACE 1 TAB  UNDER TONGUE EVERY 5 MINS AS NEEDED FOR CHEST PAIN.CALL 911 IF TAKE 2 DOSE*MAX 3/DAY     OZEMPIC, 1 MG/DOSE, 2 MG/1.5ML SOPN Inject 0.75 mLs as directed once a week.      pantoprazole (PROTONIX) 40 MG tablet Take 40 mg by mouth daily.     polyethylene glycol powder (GLYCOLAX/MIRALAX) powder Take 17 g by mouth daily as needed for mild constipation or moderate constipation.      pramipexole (MIRAPEX) 0.25 MG tablet Take 1-3 tablets by mouth daily.     PROAIR HFA 108 (90 Base) MCG/ACT inhaler Inhale into the lungs.     promethazine (PHENERGAN) 25 MG tablet Take 25 mg by mouth 4 (four) times daily as needed.     promethazine (PHENERGAN) 25 MG tablet Take by mouth.     RELISTOR 150 MG TABS Take 3 tablets by mouth as needed for constipation.     REPATHA SURECLICK 768 MG/ML SOAJ Inject into the skin.     SUMAtriptan (IMITREX) 100 MG tablet Take 100 mg by mouth as needed for migraine.     terconazole (TERAZOL 7) 0.4 % vaginal cream Place 1 applicator vaginally as needed (irritation).      topiramate (TOPAMAX) 50 MG tablet Take 50 mg by mouth 2 (two) times daily.     traZODone (DESYREL) 100 MG tablet Take 300 mg by mouth at bedtime.     TRESIBA FLEXTOUCH 200 UNIT/ML FlexTouch Pen Inject into the skin daily.     TUBERCULIN SYR 1CC/26GX3/8" 26G X 3/8" 1 ML MISC Use once weekly for subcutaneous methotrexate injection 25 each 0   VOLTAREN 1 % GEL Apply 1 application topically 2 (two) times daily as needed (pain).   5   No current facility-administered medications on file prior to visit.      Objective:  General: Alert and oriented x3 in no acute distress  Dermatology: No open lesions bilateral lower extremities, no webspace macerations, no ecchymosis bilateral, all nails x 10 are well manicured.  Vascular: Dorsalis Pedis and Posterior Tibial pedal pulses palpable, Capillary Fill Time 3 seconds,(+) pedal hair growth bilateral, no edema bilateral lower extremities, Temperature gradient within normal  limits.  Neurology: Gross sensation intact via light touch bilateral, Subjective burning/numbness to left 1st toe.   Musculoskeletal: Mild to moderate tenderness with palpation at all MTPJs on left foot diffusely. Guarding with range of motion and subjective spasm of toes even though clinically there is no active spasms going on. Limited range of motion on left foot at MTPJs.    Gait: Antalgic gait  Assessment and Plan: Problem List Items Addressed This Visit   None Visit Diagnoses     Metatarsalgia of left foot    -  Primary   Relevant Orders  DG Foot Complete Left (Completed)   Capsulitis       Left foot pain       Stress fracture of left foot, initial encounter       Possible        -Complete examination performed -Xrays reviewed from [facility]; Negative -Discussed treatment options -Rx Surgical shoe -Advised patient to try topical voltaren of which she already has at home if no better then may switch to using lidoderm patches that she has at home -Recommend rest and elevation and to limit activity  -Patient to return to office in 2 weeks or sooner if condition worsens. Advised patient if still painful may need to repeat xray or order MRI for further eval since pain has not improved since 09/2020.   Landis Martins, DPM

## 2020-12-22 ENCOUNTER — Telehealth: Payer: Self-pay

## 2020-12-22 ENCOUNTER — Ambulatory Visit: Payer: Medicare Other | Admitting: Internal Medicine

## 2020-12-22 NOTE — Telephone Encounter (Signed)
Spoke with patient, she woke up with a headache and has been vomiting. Patient has only taken one dose of injectable MTX on 12/17/2020. Patient complains of nausea, loss of appetite, and fatigue starting 12/18/2020.

## 2020-12-22 NOTE — Telephone Encounter (Signed)
Patient called stating she woke up with a headache and throwing up.  Patient states she began taking her Methotrexate last week.  Patient requested a return call to advise her on her medication.  Patient's appointment scheduled for this afternoon was cancelled.

## 2020-12-23 NOTE — Telephone Encounter (Signed)
Spoke with patient, advised Dr. Dimple Casey tried to call Ms. Speak her voice mail box is not available and no answer. Would recommend avoiding additional dose of methotrexate for now. GI symptoms are not as common for injectable methotrexate but is possible. If related, symptoms should improve with stopping this. If nausea or vomiting problems are severe or cannot drink fluids much, we could try antiemetic medicine such as zofran.   Patient is feeling better today and in agreement to hold MTX for now and discuss how to move forward at follow up visit.

## 2020-12-26 ENCOUNTER — Other Ambulatory Visit: Payer: Self-pay | Admitting: Internal Medicine

## 2020-12-26 DIAGNOSIS — M329 Systemic lupus erythematosus, unspecified: Secondary | ICD-10-CM

## 2020-12-28 ENCOUNTER — Ambulatory Visit: Payer: Medicare Other | Admitting: Internal Medicine

## 2020-12-29 ENCOUNTER — Other Ambulatory Visit: Payer: Self-pay

## 2020-12-29 ENCOUNTER — Encounter: Payer: Self-pay | Admitting: Sports Medicine

## 2020-12-29 ENCOUNTER — Ambulatory Visit (INDEPENDENT_AMBULATORY_CARE_PROVIDER_SITE_OTHER): Payer: Medicare Other | Admitting: Sports Medicine

## 2020-12-29 DIAGNOSIS — M84375D Stress fracture, left foot, subsequent encounter for fracture with routine healing: Secondary | ICD-10-CM

## 2020-12-29 DIAGNOSIS — M779 Enthesopathy, unspecified: Secondary | ICD-10-CM

## 2020-12-29 DIAGNOSIS — M792 Neuralgia and neuritis, unspecified: Secondary | ICD-10-CM

## 2020-12-29 DIAGNOSIS — M7742 Metatarsalgia, left foot: Secondary | ICD-10-CM | POA: Diagnosis not present

## 2020-12-29 DIAGNOSIS — M79672 Pain in left foot: Secondary | ICD-10-CM

## 2020-12-29 NOTE — Patient Instructions (Signed)
Nervive nerve relief supplement for your nerves can be purchased OTC at [facility]  

## 2020-12-29 NOTE — Progress Notes (Signed)
Subjective: Jamie Holmes is a 60 y.o. female patient who returns to office for follow-up evaluation of left foot pain.  Patient reports that a few days ago the pain was bad but now the pain has completely went away and the surgical shoe seems to be helping.  Patient reports that she is afraid to go without it because it has helped greatly much more than she thought it would.  Patient admits to occasional tingling sharp pain and spasming at the left second toe also feels like there is a pulling sensation after she has moved the toes.  No other pedal complaints noted.  Patient Active Problem List   Diagnosis Date Noted   Pain in left ankle and joints of left foot 12/01/2020   Right lower quadrant abdominal pain 08/02/2020   Osteoporosis 07/16/2020   GERD (gastroesophageal reflux disease) 07/16/2020   Mixed hyperlipidemia 04/12/2018   Stroke ([place]) 07/27/2017   Diabetic polyneuropathy associated with type 2 diabetes mellitus ([place]) 03/22/2017   Fibromyalgia 03/22/2017   Chest pain, unspecified 01/21/2017   Atherosclerotic heart disease of native coronary artery without angina pectoris 08/26/2016   Tremor 07/26/2015   Abnormality of gait 07/26/2015   Memory difficulty 07/26/2015   Constipation 08/11/2013   Neck pain 03/19/2013   Acquired unequal leg length 11/23/2012   Chronic liver disease 11/23/2012   Systemic lupus erythematosus, unspecified ([place]) 11/23/2012   Type 2 diabetes mellitus with diabetic polyneuropathy, with long-term current use of insulin ([place]) 11/23/2012   Insomnia 11/23/2012   Acquired hypothyroidism 11/23/2012   Asthma 08/21/2011   Hemiplegia affecting dominant side ([place]) 08/21/2011   Anxiety 06/19/2010   Interstitial cystitis 06/20/1999   Raynaud's disease 06/19/1993   Migraines 06/20/1979    Current Outpatient Medications on File Prior to Visit  Medication Sig Dispense Refill   ACCU-CHEK AVIVA PLUS test strip 1 each 3 (three) times daily.     Accu-Chek Softclix  Lancets lancets 1 each 3 (three) times daily.     aspirin 81 MG EC tablet Take 1 tablet by mouth daily.     azelastine (ASTELIN) 0.1 % nasal spray Place into both nostrils.     azithromycin (ZITHROMAX) 500 MG tablet Take 500 mg by mouth as needed. (Patient not taking: No sig reported)     Blood Glucose Monitoring Suppl (ACCU-CHEK GUIDE ME) w/Device KIT      busPIRone (BUSPAR) 5 MG tablet Take 5 mg by mouth as needed (anxiety).  (Patient not taking: No sig reported)     clopidogrel (PLAVIX) 75 MG tablet Take 75 mg by mouth daily.  3   diclofenac Sodium (VOLTAREN) 1 % GEL Apply 4 g topically 4 (four) times daily.     DULoxetine (CYMBALTA) 60 MG capsule Take 60 mg by mouth daily. (Patient not taking: No sig reported)     escitalopram (LEXAPRO) 20 MG tablet Take 20 mg by mouth daily.      folic acid (FOLVITE) 1 MG tablet Take 1 tablet (1 mg total) by mouth daily. 90 tablet 0   furosemide (LASIX) 20 MG tablet Take 20 mg by mouth 2 (two) times daily as needed.     gabapentin (NEURONTIN) 300 MG capsule Take 300 mg by mouth See admin instructions. Take 2 capsules (679m) by mouth in the AM, 1 capsule (3040m by mouth in the afternoon, and 2 capsules (60089mby mouth at bedtime.  0   HYDROcodone-acetaminophen (NORCO/VICODIN) 5-325 MG tablet Take 1 tablet by mouth in the morning, at noon, and at bedtime.  Insulin Glargine (BASAGLAR KWIKPEN) 100 UNIT/ML SOPN Inject 66 Units into the skin daily.  (Patient not taking: No sig reported)  3   levothyroxine (SYNTHROID) 137 MCG tablet Take 137 mcg by mouth daily.     levothyroxine (SYNTHROID, LEVOTHROID) 150 MCG tablet Take 150 mcg by mouth daily before breakfast.     lidocaine (LIDODERM) 5 % 1 patch daily.     Lidocaine 4 % PTCH Apply 1 patch topically 3 (three) times daily as needed (pain).     LINZESS 145 MCG CAPS capsule Take 290 mcg by mouth daily.     methadone (DOLOPHINE) 10 MG tablet Take 10 mg by mouth in the morning, at noon, and at bedtime.       methotrexate 50 MG/2ML injection Inject 0.6 mLs (15 mg total) into the skin once a week. 4 mL 0   nitroGLYCERIN (NITROSTAT) 0.4 MG SL tablet PLACE 1 TAB UNDER TONGUE EVERY 5 MINS AS NEEDED FOR CHEST PAIN.CALL 911 IF TAKE 2 DOSE*MAX 3/DAY     OZEMPIC, 1 MG/DOSE, 2 MG/1.5ML SOPN Inject 0.75 mLs as directed once a week.      pantoprazole (PROTONIX) 40 MG tablet Take 40 mg by mouth daily.     polyethylene glycol powder (GLYCOLAX/MIRALAX) powder Take 17 g by mouth daily as needed for mild constipation or moderate constipation.      pramipexole (MIRAPEX) 0.25 MG tablet Take 1-3 tablets by mouth daily.     PROAIR HFA 108 (90 Base) MCG/ACT inhaler Inhale into the lungs.     promethazine (PHENERGAN) 25 MG tablet Take 25 mg by mouth 4 (four) times daily as needed.     promethazine (PHENERGAN) 25 MG tablet Take by mouth.     RELISTOR 150 MG TABS Take 3 tablets by mouth as needed for constipation.     REPATHA SURECLICK 833 MG/ML SOAJ Inject into the skin.     SUMAtriptan (IMITREX) 100 MG tablet Take 100 mg by mouth as needed for migraine.     terconazole (TERAZOL 7) 0.4 % vaginal cream Place 1 applicator vaginally as needed (irritation).      topiramate (TOPAMAX) 50 MG tablet Take 50 mg by mouth 2 (two) times daily.     traZODone (DESYREL) 100 MG tablet Take 300 mg by mouth at bedtime.     TRESIBA FLEXTOUCH 200 UNIT/ML FlexTouch Pen Inject into the skin daily.     TUBERCULIN SYR 1CC/26GX3/8" 26G X 3/8" 1 ML MISC Use once weekly for subcutaneous methotrexate injection 25 each 0   VOLTAREN 1 % GEL Apply 1 application topically 2 (two) times daily as needed (pain).   5   No current facility-administered medications on file prior to visit.      Objective:  General: Alert and oriented x3 in no acute distress  Dermatology: No open lesions bilateral lower extremities, no webspace macerations, no ecchymosis bilateral, all nails x 10 are well manicured.  Vascular: Dorsalis Pedis and Posterior Tibial pedal  pulses palpable, Capillary Fill Time 3 seconds,(+) pedal hair growth bilateral, no edema bilateral lower extremities, Temperature gradient within normal limits.  Neurology: Gross sensation intact via light touch bilateral, Subjective: Sensation to the left second toe.  Musculoskeletal: Minimal tenderness with palpation at all MTPJs on left foot diffusely.  Improved range of motion to the MPJs of the left foot.  Assessment and Plan: Problem List Items Addressed This Visit   None Visit Diagnoses     Metatarsalgia of left foot    -  Primary  Capsulitis       Left foot pain       Stress fracture of left foot with routine healing, subsequent encounter       Neuritis            -Complete examination performed -Advised patient to continue with surgical shoe and may slowly transition from the shoe to a good supportive shoe as directed -Advised patient to try Nervive over-the-counter supplement for her nerve symptoms -Advised patient to continue with topical Lidoderm as needed -Return to office if symptoms fail to continue to improve  Landis Martins, DPM

## 2021-01-03 ENCOUNTER — Telehealth: Payer: Self-pay | Admitting: Internal Medicine

## 2021-01-03 ENCOUNTER — Other Ambulatory Visit: Payer: Self-pay

## 2021-01-03 ENCOUNTER — Encounter: Payer: Self-pay | Admitting: Internal Medicine

## 2021-01-03 ENCOUNTER — Ambulatory Visit (INDEPENDENT_AMBULATORY_CARE_PROVIDER_SITE_OTHER): Payer: Medicare Other | Admitting: Internal Medicine

## 2021-01-03 VITALS — BP 107/72 | HR 94 | Ht 63.0 in | Wt 129.0 lb

## 2021-01-03 DIAGNOSIS — M329 Systemic lupus erythematosus, unspecified: Secondary | ICD-10-CM | POA: Diagnosis not present

## 2021-01-03 DIAGNOSIS — Z79899 Other long term (current) drug therapy: Secondary | ICD-10-CM

## 2021-01-03 HISTORY — DX: Other long term (current) drug therapy: Z79.899

## 2021-01-03 NOTE — Telephone Encounter (Signed)
Patient calling because housing is having people come into all the appointments around her, and putting in new cabinets. Patient is uneasy about this, and wants to know if doctor would recommend these gentleman wear masks when in her appointment? Housing told her if a doctor said this would be a good idea they would require it. Please call to advise.

## 2021-01-03 NOTE — Progress Notes (Signed)
Office Visit Note  Patient: Jamie Holmes             Date of Birth: Feb 03, 1961           MRN: [id number]             PCP: Dustin Flock, PA-C Referring: Dustin Flock, PA-C Visit Date: 01/03/2021   Subjective:  Follow-up (Patient has been holding MTX for the last two weeks after developing potential side effects.)   History of Present Illness: Jamie Holmes is a 60 y.o. female here for follow up for lupus with symptoms of malar rashes, arthralgias, and sicca syndrome after starting methotrexate 15 mg [place] weekly. She reported not tolerating starting this medicine well due to nausea, although this symptom did not seem to correlate after stopping the methotrexate.  She continues experiencing joint pain in numerous sites her worst function limiting is currently in bilateral hip pain that affects her gait she feels she cannot tolerate much in abduction or abduction range of motion without severe pain exacerbation.   Previous HPI: 12/01/20 Jamie Holmes is a 60 y.o. female here for follow up for systemic lupus after recommending restart of hydroxychloroquine 300 mg PO daily for joint inflammation.  After starting the Plaquenil she did not notice any improvement in arthralgia symptoms and in fact felt worse all over.  She felt body aches and skin problems were exacerbated while taking the medication.  She discontinued this and feels her symptoms are just as bad now as before our last visit.  08/02/20 Jamie Holmes is a 60 y.o. female with a history of type 2 diabetes, asthma, GERD, TIA, CAD status post stent placement x3, peripheral vascular disease, lumbosacral radiculopathy, IBS, anxiety and depression here for follow up of systemic lupus. At last visit labs were checked demonstrating positive ANA without any other specific antibody titers. She continues to have similar pain as her last visit worst in the bilateral knees with her AVN but also hand pain, swelling, and stiffness. Only new symptom  is since 2 days ago RLQ abdominal pain that feels like a stabbing sensation and greatly decreased appetite. She denies blood in urine or stools, and has passed stools since the onset.   07/16/20 Jamie Holmes is a 60 y.o. female with a history of type 2 diabetes, asthma, GERD, TIA, CAD status post stent placement x3, peripheral vascular disease, lumbosacral radiculopathy, IBS, anxiety and depression here for evaluation and management of systemic lupus. She was originally diagnosed with lupus with what sounds like symptoms of malar rash, arthralgias, and sicca syndrome. She was treated at some point with prednisone which caused terrible adverse reactions and huge weight gain with striae. She took Plaquenil at some time does not recall exactly which dates but not since decades ago. 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 However earlier this month she right facial paresthesias and right arm and leg weakness with negative imaging workup felt to be consistent with TIA. Due to her history of multiple vascular problems and previous SLE not on treatment recommended to see for evaluation.   Currently describes intermittent parotid swelling, dry mouth and eyes, lymphadenopathy, raynaud's, arthralgias, and fatigue. No known history of blood clots or renal disease. Previous labs 2017 showing positive ANA without lupus specific ENA profiles positive.  Labs reviewed 06/2020 ANA 1:320 homogenous dsDNa neg Complement C3  C4 wnl ACA neg B2GP neg LA confirmatory test neg ESR 33 CBC MCV 78.9, neutrophils 1402, eos 8, else unremarkable   07/2015 HIV neg   Previous lupus manifestations Arthralgias Sicca syndrome Lymphadenopathy Raynaud's without gangrene Fatigue CAD Bilateral knee AVN   Previous lupus treatments HCQ  Review of  Systems  Constitutional:  Positive for fatigue.  HENT:  Positive for mouth sores, mouth dryness and nose dryness.   Eyes:  Positive for pain, itching, visual disturbance and dryness.  Respiratory:  Positive for cough, shortness of breath and difficulty breathing. Negative for hemoptysis.   Cardiovascular:  Positive for chest pain, palpitations and swelling in legs/feet.  Gastrointestinal:  Positive for abdominal pain, blood in stool and constipation. Negative for diarrhea.  Endocrine: Positive for increased urination.  Genitourinary:  Positive for painful urination.  Musculoskeletal:  Positive for joint pain, joint pain, joint swelling, myalgias, muscle weakness, morning stiffness, muscle tenderness and myalgias.  Skin:  Positive for color change, rash and redness.  Allergic/Immunologic: Negative for susceptible to infections.  Neurological:  Positive for dizziness, numbness, headaches and memory loss. Negative for weakness.  Hematological:  Positive for swollen glands.  Psychiatric/Behavioral:  Positive for confusion and sleep disturbance.    PMFS History:  Patient Active Problem List   Diagnosis Date Noted   High risk medication use 01/03/2021   Pain in left ankle and joints of left foot 12/01/2020   Right lower quadrant abdominal pain 08/02/2020   Osteoporosis 07/16/2020   GERD (gastroesophageal reflux disease) 07/16/2020   Mixed hyperlipidemia 04/12/2018   Stroke ([place]) 07/27/2017   Diabetic polyneuropathy associated with type 2 diabetes mellitus ([place]) 03/22/2017   Fibromyalgia 03/22/2017   Chest pain, unspecified 01/21/2017   Atherosclerotic heart disease of native coronary artery without angina pectoris 08/26/2016   Tremor 07/26/2015   Abnormality of gait 07/26/2015   Memory difficulty 07/26/2015   Constipation 08/11/2013   Neck pain 03/19/2013   Acquired unequal leg length 11/23/2012   Chronic liver disease 11/23/2012   Systemic lupus erythematosus, unspecified ([place])  11/23/2012   Type 2 diabetes mellitus with diabetic polyneuropathy, with long-term current use of insulin ([place]) 11/23/2012   Insomnia 11/23/2012   Acquired hypothyroidism 11/23/2012   Asthma 08/21/2011   Hemiplegia affecting dominant side ([place]) 08/21/2011   Anxiety 06/19/2010   Interstitial cystitis 06/20/1999   Raynaud's disease 06/19/1993   Migraines 06/20/1979    Past Medical History:  Diagnosis Date   Avascular necrosis ([place])    Diabetes mellitus without complication ([place])    Fibromyalgia    Hypothyroidism    Interstitial cystitis    Liver disease    fatty liver; non-alcoholic   Lupus (systemic lupus erythematosus) ([place])    Osteoarthritis    Raynaud disease    Sjogren's disease ([place])    Thyroid disease     Family History  Problem Relation Age of Onset   Stroke Mother    Hypothyroidism Mother    High Cholesterol Mother    Heart disease Mother    Heart disease Father    Stroke Father    Heart attack Father    Arthritis Father    Diabetes Father    Diabetes Brother    Hyperthyroidism Brother    Heart disease Brother    Breast cancer Maternal Aunt        3 mat aunts with breast cancer   Heart disease Maternal Grandmother    Cancer Maternal Grandmother    Arthritis Maternal Grandmother    Diabetes Maternal Grandmother  Heart disease Maternal Grandfather    Cancer Maternal Grandfather    Arthritis Maternal Grandfather    Heart disease Paternal Grandmother    Arthritis Paternal Grandmother    Stroke Paternal Grandmother    Heart attack Paternal Grandmother    Diabetes Paternal Grandmother    Heart disease Paternal Grandfather    Arthritis Paternal Grandfather    Heart attack Paternal Grandfather    Diabetes Paternal Grandfather    Past Surgical History:  Procedure Laterality Date   ABDOMINAL HYSTERECTOMY     BILATERAL OOPHORECTOMY     bladder tack     BREAST SURGERY     lump, right breast   heart stent  2018   NOSE SURGERY     Social History    Social History Narrative   Patient drinks 2 sodas weekly.   Patient is right handed.       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  Skin:    General: Skin is warm and dry.     Findings: No rash.     Comments: Generalized hair thinning worst in frontal distribution, without scarring or rash     Musculoskeletal Exam:  Shoulders painful lateral and posterior aspects without swelling, ROM seems intact but limited by pain Wrists full ROM no tenderness or swelling Fingers tender to palpation over right 2nd MCP and 5th PIP without palpable synovitis Bilateral hip pain with rotation and with abduction and adduction ROM, able to flex and extend but reports pain with resisted hip flexion  Investigation: No additional findings.  Imaging: DG Foot Complete Left  Result Date: 12/14/2020 Please see detailed radiograph report in office note.   Recent Labs: Lab Results  Component Value Date   WBC 4.1 07/16/2020   HGB 11.9 07/16/2020   PLT 224 07/16/2020   NA 138 07/07/2020   K 3.8 07/07/2020   CL 102 07/07/2020   CO2 25 07/07/2020   GLUCOSE 85 07/07/2020   BUN 10 07/07/2020   CREATININE 0.62 07/07/2020   BILITOT 0.4 07/07/2020   ALKPHOS 57 07/07/2020   AST 22 07/07/2020   ALT 19 07/07/2020   PROT 6.9 07/07/2020   ALBUMIN 3.8 07/07/2020   CALCIUM 9.5 07/07/2020   GFRAA >60 12/06/2019   QFTBGOLDPLUS NEGATIVE 01/03/2021    Speciality Comments: No specialty comments available.  Procedures:  No procedures  performed Allergies: Atorvastatin, Ivp dye [iodinated diagnostic agents], Other, Penicillins, and Prednisone   Assessment / Plan:     Visit Diagnoses: Systemic lupus erythematosus, unspecified SLE type, unspecified organ involvement status ([place]) - Plan: QuantiFERON-TB Gold Plus, Hepatitis C antibody, Hepatitis B surface antigen, Hepatitis B core antibody, IgM  Still with history of lupus with several manifestations currently only symptom is joint pains in multiple areas and fatigue.  It does not seem like methotrexate was likely causing her side effects but she is extremely averse to taking any medicine that could exacerbate her alopecia.  She would be interested in trying alternative  lupus treatment for her arthralgias but does not share the side effect such as Benlysta.  High risk medication use  Discussed risks of Benlysta medication provided information about this to review.  Checking baseline hepatitis serology and QuantiFERON.  She does have multiple psychiatric diagnoses and treatment but seems stable currently.  Orders: Orders Placed This Encounter  Procedures   QuantiFERON-TB Gold Plus   Hepatitis C antibody   Hepatitis B surface antigen   Hepatitis B core antibody, IgM    No orders of the defined types were placed in this encounter.    Follow-Up Instructions: No follow-ups on file.   Collier Salina, MD  Note - This record has been created using [employer].  Chart creation errors have been sought, but may not always  have been located. Such creation errors do not reflect on  the standard of medical care.

## 2021-01-03 NOTE — Patient Instructions (Signed)
Belimumab solution for injection What is this medication? BELIMUMAB (be LIM ue mab) is a monoclonal antibody. It is used to treat active systemic lupus erythematosus (SLE) or active lupus nephritis (lupus-related kidney inflammation). This drug is used with standard therapy for theseconditions. It is not a cure. This medicine may be used for other purposes; ask your health care provider orpharmacist if you have questions. COMMON BRAND NAME(S): Benlysta, Benlysta [place] What should I tell my care team before I take this medication? They need to know if you have any of these conditions: cancer depression immune system problems infection (especially a virus infection such as chickenpox, cold sores, or herpes) mental illness recently received or scheduled to receive a vaccination suicidal thoughts, plans, or attempt or a previous suicide attempt by you or a family member an unusual or allergic reaction to belimumab, other medicines, foods, dyes, or preservatives pregnant or trying to get pregnant breast-feeding How should I use this medication? This drug is injected into a vein or injected under the skin. Injections into a vein are given by a health care provider in a hospital or clinic setting. Injections under the skin may be given at home. If you get this drug at home, you will be taught how to prepare and give it under the skin. Use exactly as directed. Take it as directed on the prescription label at the same time everyday. Keep taking it unless your health care provider tells you to stop. This drug comes with INSTRUCTIONS FOR USE. Ask your pharmacist for directions on how to use this drug. Read the information carefully. Talk to yourpharmacist or health care provider if you have questions. It is important that you put your used needles and syringes in a special sharps container. Do not put them in a trash can. If you do not have a sharpscontainer, call your pharmacist or health care provider to get  one. A special MedGuide will be given to you by the pharmacist with each prescription and refill. If you are getting this drug in a hospital or clinic, a special MedGuide will be given to you before each treatment. Be sure to readthis information carefully each time. Talk to your health care provider about the use of this drug in children. While it be prescribed for children as young as 5 years for selected conditions,precautions do apply. Overdosage: If you think you have taken too much of this medicine contact apoison control center or emergency room at once. NOTE: This medicine is only for you. Do not share this medicine with others. What if I miss a dose? This drug is used once a week if injected under the skin. If you miss your dose, inject a dose as soon as you remember. Then, inject your next dose at your regularly scheduled time or continue weekly dosing based on the new day injected. Call your health care provider if you are not sure what to do. Do notuse 2 doses on the same day to make up for a missed dose. If you are to be given an injection into the vein, keep appointments for follow-up doses. It is important not to miss your dose. Call your health careprovider if you are unable to keep an appointment. What may interact with this medication? Do not take this medicine with any of the following medicines: live virus vaccines This medicine may also interact with the following medicines: biologic medicines such as adalimumab, certolizumab, etanercept, golimumab, infliximab cyclophosphamide monoclonal antibodies such as ocrelizumab, ofatumumab, rituximab This list   may not describe all possible interactions. Give your health care provider a list of all the medicines, herbs, non-prescription drugs, or dietary supplements you use. Also tell them if you smoke, drink alcohol, or use illegaldrugs. Some items may interact with your medicine. What should I watch for while using this medication? Visit  your health care provider for regular checks on your progress. Tell your health care provider if your symptoms do not start to get better or if they getworse. This drug may increase your risk of getting an infection. Call your health care provider for advice if you get a fever, chills, or sore throat, or other symptoms of a cold or flu. Do not treat yourself. Try to avoid being aroundpeople who are sick. In some patients, this drug may cause a serious brain infection that may cause death. If you have any problems seeing, thinking, speaking, walking, or standing, tell your doctor right away. If you cannot reach your health careprovider, urgently seek other source of medical care. Talk to your health care provider about your risk of cancer. You may be more atrisk for certain types of cancer if you take this drug. What side effects may I notice from receiving this medication? Side effects that you should report to your doctor or health care professionalas soon as possible: allergic reactions (skin rash, itching or hives; swelling of the face, lips, or tongue) anxious breathing problems changes in emotions or moods confusion depressed mood dizziness feeling faint or lightheaded loss of memory problems with balance, talking, or walking signs and symptoms of infection like fever; chills; cough; sore throat; pain or trouble passing urine suicidal thoughts trouble sleeping unusually slow heartbeat Side effects that usually do not require medical attention (report to yourdoctor or health care professional if they continue or are bothersome): diarrhea headache muscle pain nausea pain, redness or irritation at site where injected stuffy or runny nose This list may not describe all possible side effects. Call your doctor for medical advice about side effects. You may report side effects to FDA at1-800-FDA-1088. Where should I keep my medication? If injected into the vein, this drug will be given in  a hospital or clinic. Itwill not be stored at home. Storage for syringes and auto injectors stored at home: Keep out of the reach of children. Store in a refrigerator between 2 and 8 degrees C (36 and 46 degrees F). Keep this medicine in the original container. Protect from light. Do not freeze. Do not shake. Do not use this medicine and do not place it back in the refrigerator if left out at room temperature for more than 12 hours. Throw awayany unused medicine after the expiration date. NOTE: This sheet is a summary. It may not cover all possible information. If you have questions about this medicine, talk to your doctor, pharmacist, orhealth care provider.  2022 Elsevier/Gold Standard (2019-06-08 16:07:36)  

## 2021-01-04 NOTE — Telephone Encounter (Signed)
Spoke with patient, advised, per Dr. Dimple Casey, she is relatively immunosuppressed due to her existing autoimmune disease and medication treatment for this, so precautions for this would begood if they are able to accommodate this such as masks while in her home.

## 2021-01-04 NOTE — Telephone Encounter (Signed)
Ms. Farrier is relatively immunosuppressed due to her existing autoimmune disease and medication treatment for this, so precautions for this would be good if they are able to accommodate this such as masks while in her home.

## 2021-01-05 LAB — QUANTIFERON-TB GOLD PLUS
Mitogen-NIL: >10 IU/mL
NIL: 0.05 IU/mL
QuantiFERON-TB Gold Plus: NEGATIVE
TB1-NIL: 0 IU/mL
TB2-NIL: <0 IU/mL

## 2021-01-05 LAB — HEPATITIS C ANTIBODY
Hepatitis C Ab: NONREACTIVE
SIGNAL TO CUT-OFF: 0.11 (ref <1.00)

## 2021-01-05 LAB — HEPATITIS B SURFACE ANTIGEN: Hepatitis B Surface Ag: NONREACTIVE

## 2021-01-05 LAB — HEPATITIS B CORE ANTIBODY, IGM: Hep B C IgM: NONREACTIVE

## 2021-02-03 DIAGNOSIS — M47816 Spondylosis without myelopathy or radiculopathy, lumbar region: Secondary | ICD-10-CM | POA: Insufficient documentation

## 2021-02-03 HISTORY — DX: Spondylosis without myelopathy or radiculopathy, lumbar region: M47.816

## 2021-03-03 ENCOUNTER — Other Ambulatory Visit: Payer: Self-pay | Admitting: Internal Medicine

## 2021-03-03 DIAGNOSIS — M329 Systemic lupus erythematosus, unspecified: Secondary | ICD-10-CM

## 2021-04-06 ENCOUNTER — Other Ambulatory Visit: Payer: Self-pay | Admitting: Internal Medicine

## 2021-04-06 DIAGNOSIS — M329 Systemic lupus erythematosus, unspecified: Secondary | ICD-10-CM

## 2021-04-06 NOTE — Telephone Encounter (Signed)
Next Visit: not scheduled.   Last Visit: 01/03/2021  Last Fill:12/01/2020  Dx: Systemic lupus erythematosus, unspecified SLE type, unspecified organ involvement status   Current Dose per Rx on 12/01/2020: Take 1 tablet (1 mg total) by mouth daily.  Okay to refill Folic Acid?  When do you want patient to follow up?

## 2021-07-05 ENCOUNTER — Ambulatory Visit (INDEPENDENT_AMBULATORY_CARE_PROVIDER_SITE_OTHER): Payer: Commercial Managed Care - HMO | Admitting: Vascular Surgery

## 2021-07-05 ENCOUNTER — Encounter: Payer: Self-pay | Admitting: Vascular Surgery

## 2021-07-05 ENCOUNTER — Other Ambulatory Visit: Payer: Self-pay

## 2021-07-05 DIAGNOSIS — I82619 Acute embolism and thrombosis of superficial veins of unspecified upper extremity: Secondary | ICD-10-CM | POA: Insufficient documentation

## 2021-07-05 DIAGNOSIS — I82611 Acute embolism and thrombosis of superficial veins of right upper extremity: Secondary | ICD-10-CM | POA: Diagnosis not present

## 2021-07-05 HISTORY — DX: Acute embolism and thrombosis of superficial veins of unspecified upper extremity: I82.619

## 2021-07-05 NOTE — Progress Notes (Signed)
Patient name: Jamie Holmes MRN: [id number] DOB: 30-Apr-1961 Sex: female  REASON FOR CONSULT: Upper extremity superficial venous thrombosis  HPI: Jamie Holmes is a 61 y.o. female, with history of diabetes, lupus, Sjogren's who presents for evaluation of upper extremity superficial venous thrombosis in the right arm.  Patient states she initially noticed several cords that were palpable in the right forearm beginning in December of this year.  This was shortly after she was discharged from prolonged hospital stay from October to just before Thanksgiving at [facility] following acute cholecystitis including complication of biloma and cardiac arrest.  She states her forearm cords were previous IV sites.  She had ultrasound study done on 06/07/2021 that showed two palpable abnormalities likely represent superficial thrombosed veins.  She has had no other history of DVTs.  She is on aspirin Plavix.  She does feel like she is having pain in one of the focal areas but otherwise they are improving.  Past Medical History:  Diagnosis Date   Avascular necrosis ([place])    Diabetes mellitus without complication ([place])    Fibromyalgia    Hypothyroidism    Interstitial cystitis    Liver disease    fatty liver; non-alcoholic   Lupus (systemic lupus erythematosus) ([place])    Osteoarthritis    Raynaud disease    Sjogren's disease ([place])    Thyroid disease     Past Surgical History:  Procedure Laterality Date   ABDOMINAL HYSTERECTOMY     BILATERAL OOPHORECTOMY     bladder tack     BREAST SURGERY     lump, right breast   heart stent  2018   NOSE SURGERY      Family History  Problem Relation Age of Onset   Stroke Mother    Hypothyroidism Mother    High Cholesterol Mother    Heart disease Mother    Heart disease Father    Stroke Father    Heart attack Father    Arthritis Father    Diabetes Father    Diabetes Brother    Hyperthyroidism Brother    Heart disease Brother    Breast cancer  Maternal Aunt        3 mat aunts with breast cancer   Heart disease Maternal Grandmother    Cancer Maternal Grandmother    Arthritis Maternal Grandmother    Diabetes Maternal Grandmother    Heart disease Maternal Grandfather    Cancer Maternal Grandfather    Arthritis Maternal Grandfather    Heart disease Paternal Grandmother    Arthritis Paternal Grandmother    Stroke Paternal Grandmother    Heart attack Paternal Grandmother    Diabetes Paternal Grandmother    Heart disease Paternal Grandfather    Arthritis Paternal Grandfather    Heart attack Paternal Grandfather    Diabetes Paternal Grandfather     SOCIAL HISTORY: Social History   Socioeconomic History   Marital status: Married    Spouse name: Not on file   Number of children: 2   Years of education: student   Highest education level: Not on file  Occupational History   Occupation: student  Tobacco Use   Smoking status: Never   Smokeless tobacco: Never  Vaping Use   Vaping Use: Never used  Substance and Sexual Activity   Alcohol use: No   Drug use: No   Sexual activity: Not on file  Other Topics Concern   Not on file  Social History Narrative   Patient drinks 2 sodas  weekly.   Patient is right handed.       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     Accu-Chek Softclix Lancets lancets 1 each 3 (three) times daily.     aspirin 81 MG EC tablet Take 1 tablet by mouth daily.     azelastine (ASTELIN) 0.1 % nasal spray Place into both  nostrils as needed.     Blood Glucose Monitoring Suppl (ACCU-CHEK GUIDE ME) w/Device KIT      busPIRone (BUSPAR) 5 MG tablet Take 5 mg by mouth as needed (anxiety).     clopidogrel (PLAVIX) 75 MG tablet Take 75 mg by mouth daily.  3   diclofenac Sodium (VOLTAREN) 1 % GEL Apply 4 g topically as needed.     DULoxetine (CYMBALTA) 60 MG capsule Take 60 mg by mouth daily.     escitalopram (LEXAPRO) 20 MG tablet Take 20 mg by mouth daily.      furosemide (LASIX) 20 MG tablet Take 20 mg by mouth 2 (two) times daily as needed.     gabapentin (NEURONTIN) 300 MG capsule Take 300 mg by mouth See admin instructions. Take 2 capsules (671m) by mouth in the AM, 1 capsule (3021m by mouth in the afternoon, and 2 capsules (6006mby mouth at bedtime.  0   HYDROcodone-acetaminophen (NORCO/VICODIN) 5-325 MG tablet Take 1 tablet by mouth in the morning, at noon, and at bedtime.     levothyroxine (SYNTHROID) 137 MCG tablet Take 137 mcg by mouth daily.     lidocaine (LIDODERM) 5 % 1 patch daily.     LINZESS 145 MCG CAPS capsule Take 290 mcg by mouth daily.     methadone (DOLOPHINE) 10 MG tablet Take 10 mg by mouth in the morning, at noon, and at bedtime.      nitroGLYCERIN (NITROSTAT) 0.4 MG SL tablet PLACE 1 TAB UNDER TONGUE EVERY 5 MINS AS NEEDED FOR CHEST PAIN.CALL 911 IF TAKE 2 DOSE*MAX 3/DAY     OZEMPIC, 1 MG/DOSE, 2 MG/1.5ML SOPN Inject 0.75 mLs as directed once a week.      pantoprazole (PROTONIX) 40 MG tablet Take 40 mg by mouth daily.     polyethylene glycol powder (GLYCOLAX/MIRALAX) powder Take 17 g by mouth daily as needed for mild constipation or moderate constipation.      pramipexole (MIRAPEX) 0.25 MG tablet Take 1-3 tablets by mouth daily.     PROAIR HFA 108 (90 Base) MCG/ACT inhaler Inhale into the lungs.     promethazine (PHENERGAN) 25 MG tablet Take 25 mg by mouth 4 (four) times daily as needed.     RELISTOR 150 MG TABS Take 3 tablets by mouth as needed for constipation.     REPATHA SURECLICK 140175G/ML SOAJ Inject into the skin.     SUMAtriptan (IMITREX) 100 MG tablet Take 100 mg by mouth as needed for migraine.     terconazole (TERAZOL 7) 0.4 % vaginal cream Place  1 applicator vaginally as needed (irritation).      topiramate (TOPAMAX) 50 MG tablet Take 50 mg by mouth 2 (two) times daily.     traZODone (DESYREL) 100 MG tablet Take 300 mg by mouth at bedtime.     TRESIBA FLEXTOUCH 200 UNIT/ML FlexTouch Pen Inject into the skin daily.     TUBERCULIN SYR 1CC/26GX3/8" 26G X 3/8" 1 ML MISC Use once weekly for subcutaneous methotrexate injection 25 each 0   VOLTAREN 1 % GEL Apply 1 application topically 2 (two) times daily as needed (pain).  5   azithromycin (ZITHROMAX) 500 MG tablet Take 500 mg by mouth as needed. (Patient not taking: Reported on 01/11/2034)     folic acid (FOLVITE) 1 MG tablet TAKE 1 TABLET BY MOUTH EVERY DAY (Patient not taking: Reported on 07/05/2021) 90 tablet 0   Insulin Glargine (BASAGLAR KWIKPEN) 100 UNIT/ML SOPN Inject 66 Units into the skin daily.  (Patient not taking: Reported on 07/16/2020)  3   levothyroxine (SYNTHROID, LEVOTHROID) 150 MCG tablet Take 150 mcg by mouth daily before breakfast. (Patient not taking: Reported on 01/03/2021)     Lidocaine 4 % PTCH Apply 1 patch topically 3 (three) times daily as needed (pain). (Patient not taking: Reported on 07/05/2021)     methotrexate 50 MG/2ML injection Inject 0.6 mLs (15 mg total) into the skin once a week. (Patient not taking: Reported on 01/03/2021) 4 mL 0   promethazine (PHENERGAN) 25 MG tablet Take by mouth. (Patient not taking: Reported on 01/03/2021)     No current facility-administered medications for this visit.    REVIEW OF SYSTEMS:  '[X]'  denotes positive finding, '[ ]'  denotes negative finding Cardiac  Comments:  Chest pain or chest pressure:    Shortness of breath upon exertion:    Short of breath when lying flat:    Irregular heart rhythm:        Vascular    Pain in calf, thigh, or hip brought on by  ambulation:    Pain in feet at night that wakes you up from your sleep:     Blood clot in your veins: x Right forearm  Leg swelling:         Pulmonary    Oxygen at home:    Productive cough:     Wheezing:         Neurologic    Sudden weakness in arms or legs:     Sudden numbness in arms or legs:     Sudden onset of difficulty speaking or slurred speech:    Temporary loss of vision in one eye:     Problems with dizziness:         Gastrointestinal    Blood in stool:     Vomited blood:         Genitourinary    Burning when urinating:     Blood in urine:        Psychiatric    Major depression:         Hematologic    Bleeding problems:    Problems with blood clotting too easily:        Skin    Rashes or ulcers:        Constitutional    Fever or chills:      PHYSICAL EXAM: Vitals:   07/05/21 1349  BP: 114/71  Pulse: 92  Resp: 16  Temp: 98.4 F (36.9 C)  TempSrc: Temporal  Weight: 125 lb (56.7 kg)  Height: 5'  3.5" (1.613 m)    GENERAL: The patient is a well-nourished female, in no acute distress. The vital signs are documented above. CARDIAC: There is a regular rate and rhythm.  VASCULAR:  Palpable radial pulses bilateral upper extremities Two small focal cords on the right medial and lateral forearm consistent with superficial venous thrombosis with no overlying skin changes PULMONARY: No respiratory changes ABDOMEN: Soft and non-tender  MUSCULOSKELETAL: There are no major deformities or cyanosis. NEUROLOGIC: No focal weakness or paresthesias are detected. PSYCHIATRIC: The patient has a normal affect.  DATA:   Upper extremity ultrasound 06/07/2021 from [facility] radiology notes hyperechoic superficial tubular structures within the right medial lateral forearm consistent with superficial thrombosed veins.  The ulnar and radial veins are normal.  Assessment/Plan:  61 year old female presents for evaluation of superficial venous thrombosis of the right  upper extremity.  This is at the location of previous IV site following a prolonged hospital stay last year at [facility].  I discussed there is traditionally no role for full anticoagulation and should be no significant risk for thromboembolism.  Discussed warm compresses with NSAIDs for conservative management.  She can follow with me with any worsening symptoms in the future.  This should improve with conservative management.  Already improvement over the past 2-3 weeks.   Marty Heck, MD [facility] Office: [phone]

## 2021-08-29 ENCOUNTER — Other Ambulatory Visit: Payer: Self-pay | Admitting: Internal Medicine

## 2021-08-29 DIAGNOSIS — M329 Systemic lupus erythematosus, unspecified: Secondary | ICD-10-CM

## 2021-08-29 NOTE — Telephone Encounter (Signed)
Patient not seen since July 2022, was not interested in continuing methotrexate at that time. We had discussed possible change to [facility]. Would need to follow up if making any new changes to medications.

## 2021-08-29 NOTE — Telephone Encounter (Signed)
Spoke with patient and scheduled an appointment on 09/27/2021.  ?

## 2021-09-27 ENCOUNTER — Ambulatory Visit: Payer: Commercial Managed Care - HMO | Admitting: Internal Medicine

## 2021-09-27 NOTE — Progress Notes (Deleted)
? ?Office Visit Note ? ?Patient: Jamie Holmes             ?Date of Birth: 29-Apr-1961           ?MRN: [id number]             ?PCP: Barnetta Chapel, NP ?Referring: Barnetta Chapel, NP ?Visit Date: 09/27/2021 ? ? ?Subjective:  ?No chief complaint on file. ? ? ?History of Present Illness: Jamie Holmes is a 61 y.o. female here for follow up for SLE previously stopped MTX due to excessive alopecia, had discussed belysta as alternative option.***  ? ?Previous HPI ?01/03/21 ?Jamie Holmes is a 61 y.o. female here for follow up for lupus with symptoms of malar rashes, arthralgias, and sicca syndrome after starting methotrexate 15 mg [place] weekly. She reported not tolerating starting this medicine well due to nausea, although this symptom did not seem to correlate after stopping the methotrexate.  She continues experiencing joint pain in numerous sites her worst function limiting is currently in bilateral hip pain that affects her gait she feels she cannot tolerate much in abduction or abduction range of motion without severe pain exacerbation. ? ?Previous HPI: ?07/16/20 ?[name] is a 61 y.o. female with a history of type 2 diabetes, asthma, GERD, TIA, CAD status post stent placement x3, peripheral vascular disease, lumbosacral radiculopathy, IBS, anxiety and depression here for evaluation and management of systemic lupus. She was originally diagnosed with lupus with what sounds like symptoms of malar rash, arthralgias, and sicca syndrome. She was treated at some point with prednisone which caused terrible adverse reactions and huge weight gain with striae. She took Plaquenil at some time does not recall exactly which dates but not since decades ago. 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 However earlier this month she right facial  paresthesias and right arm and leg weakness with negative imaging workup felt to be consistent with TIA. Due to her history of multiple vascular problems and previous SLE not on treatment recommended to see for evaluation. ?  ?Currently describes intermittent parotid swelling, dry mouth and eyes, lymphadenopathy, raynaud's, arthralgias, and fatigue. No known history of blood clots or renal disease. ?Previous labs 2017 showing positive ANA without lupus specific ENA profiles positive. ?  ?Labs reviewed ?06/2020 ?ANA 1:320 homogenous ?dsDNa neg ?Complement C3 C4 wnl ?ACA neg ?B2GP neg ?LA confirmatory test neg ?ESR 33 ?CBC MCV 78.9, neutrophils 1402, eos 8, else unremarkable ?  ?07/2015 ?HIV neg ?  ?Previous lupus manifestations ?Arthralgias ?Sicca syndrome ?Lymphadenopathy ?Raynaud's without gangrene ?Fatigue ?CAD ?Bilateral knee AVN ?  ?Previous lupus treatments ?HCQ ? ? ?No Rheumatology ROS completed.  ? ?PMFS History:  ?Patient Active Problem List  ? Diagnosis Date Noted  ? Superficial venous thrombosis of arm 07/05/2021  ? High risk medication use 01/03/2021  ? Pain in left ankle and joints of left foot 12/01/2020  ? Right lower quadrant abdominal pain 08/02/2020  ? Osteoporosis 07/16/2020  ? GERD (gastroesophageal reflux disease) 07/16/2020  ? Mixed hyperlipidemia 04/12/2018  ? Stroke ([place]) 07/27/2017  ? Diabetic polyneuropathy associated with type 2 diabetes mellitus ([place]) 03/22/2017  ? Fibromyalgia 03/22/2017  ? Chest pain, unspecified 01/21/2017  ? Atherosclerotic heart disease of native coronary artery without angina pectoris 08/26/2016  ? Tremor 07/26/2015  ? Abnormality of gait 07/26/2015  ? Memory difficulty 07/26/2015  ?  Constipation 08/11/2013  ? Neck pain 03/19/2013  ? Acquired unequal leg length 11/23/2012  ? Chronic liver disease 11/23/2012  ? Systemic lupus erythematosus, unspecified ([place]) 11/23/2012  ? Type 2 diabetes mellitus with diabetic polyneuropathy, with long-term current use of insulin ([place])  11/23/2012  ? Insomnia 11/23/2012  ? Acquired hypothyroidism 11/23/2012  ? Asthma 08/21/2011  ? Hemiplegia affecting dominant side ([place]) 08/21/2011  ? Anxiety 06/19/2010  ? Interstitial cystitis 06/20/1999  ? Raynaud's disease 06/19/1993  ? Migraines 06/20/1979  ?  ?Past Medical History:  ?Diagnosis Date  ? Avascular necrosis ([place])   ? Diabetes mellitus without complication ([place])   ? Fibromyalgia   ? Hypothyroidism   ? Interstitial cystitis   ? Liver disease   ? fatty liver; non-alcoholic  ? Lupus (systemic lupus erythematosus) ([place])   ? Osteoarthritis   ? Raynaud disease   ? Sjogren's disease ([place])   ? Thyroid disease   ?  ?Family History  ?Problem Relation Age of Onset  ? Stroke Mother   ? Hypothyroidism Mother   ? High Cholesterol Mother   ? Heart disease Mother   ? Heart disease Father   ? Stroke Father   ? Heart attack Father   ? Arthritis Father   ? Diabetes Father   ? Diabetes Brother   ? Hyperthyroidism Brother   ? Heart disease Brother   ? Breast cancer Maternal Aunt   ?     3 mat aunts with breast cancer  ? Heart disease Maternal Grandmother   ? Cancer Maternal Grandmother   ? Arthritis Maternal Grandmother   ? Diabetes Maternal Grandmother   ? Heart disease Maternal Grandfather   ? Cancer Maternal Grandfather   ? Arthritis Maternal Grandfather   ? Heart disease Paternal Grandmother   ? Arthritis Paternal Grandmother   ? Stroke Paternal Grandmother   ? Heart attack Paternal Grandmother   ? Diabetes Paternal Grandmother   ? Heart disease Paternal Grandfather   ? Arthritis Paternal Grandfather   ? Heart attack Paternal Grandfather   ? Diabetes Paternal Grandfather   ? ?Past Surgical History:  ?Procedure Laterality Date  ? ABDOMINAL HYSTERECTOMY    ? BILATERAL OOPHORECTOMY    ? bladder tack    ? BREAST SURGERY    ? lump, right breast  ? heart stent  2018  ? NOSE SURGERY    ? ?Social History  ? ?Social History Narrative  ? Patient drinks 2 sodas weekly.  ? Patient is right handed.   ?   ? Pt lives alone in 1  story home  ? Has 2 adult children  ? Associated degree  ? 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 ?Immunization History  ?Administered Date(s) Administered  ? PFIZER(Purple Top)SARS-COV-2 Vaccination 08/28/2019, 09/18/2019, 06/14/2020  ?  ? ?Objective: ?Vital Signs: There were no vitals taken for this visit.  ? ?Physical Exam  ? ?Musculoskeletal Exam: *** ? ?CDAI Exam: ?CDAI Score: -- ?Patient Global: --; Provider Global: -- ?Swollen: --; Tender: -- ?Joint Exam 09/27/2021  ? ?No joint exam has been documented for this visit  ? ?There is currently no information documented on the homunculus. Go to the Rheumatology activity and complete the homunculus joint exam. ? ?Investigation: ?No additional findings. ? ?Imaging: ?No results found. ? ?Recent Labs: ?Lab Results  ?Component Value Date  ? WBC 4.1 07/16/2020  ? HGB 11.9 07/16/2020  ? PLT 224 07/16/2020  ? NA 138 07/07/2020  ? K 3.8 07/07/2020  ? CL  102 07/07/2020  ? CO2 25 07/07/2020  ? GLUCOSE 85 07/07/2020  ? BUN 10 07/07/2020  ? CREATININE 0.62 07/07/2020  ? BILITOT 0.4 07/07/2020  ? ALKPHOS 57 07/07/2020  ? AST 22 07/07/2020  ? ALT 19 07/07/2020  ? PROT 6.9 07/07/2020  ? ALBUMIN 3.8 07/07/2020  ? CALCIUM 9.5 07/07/2020  ? GFRAA >60 12/06/2019  ? QFTBGOLDPLUS NEGATIVE 01/03/2021  ? ? ?Speciality Comments: No specialty comments available. ? ?Procedures:  ?No procedures performed ?Allergies: Atorvastatin, Ivp dye [iodinated contrast media], Other, Penicillins, and Prednisone  ? ?Assessment / Plan:     ?Visit Diagnoses: No diagnosis found. ? ?*** ? ?Orders: ?No orders of the defined types were placed in this encounter. ? ?No orders of the defined types were placed in this encounter. ? ? ? ?Follow-Up Instructions: No follow-ups on file. ? ? ?Collier Salina, MD ? ?Note - This record has been created using [employer].  ?Chart creation errors have been sought, but may not always  ?have been located. Such creation errors do not reflect on  ?the standard of  medical care. ? ?

## 2021-10-24 NOTE — Progress Notes (Deleted)
Office Visit Note  Patient: Jamie Holmes             Date of Birth: 1960/09/13           MRN: [id number]             PCP: Barnetta Chapel, NP Referring: Barnetta Chapel, NP Visit Date: 10/31/2021   Subjective:  No chief complaint on file.   History of Present Illness: Jamie Holmes is a 61 y.o. female here for follow up for lupus with symptoms of malar rashes, arthralgias, and sicca syndrome after starting methotrexate 15 mg [place] weekly.    Previous HPI 01/03/2021 Jamie Holmes is a 61 y.o. female here for follow up for lupus with symptoms of malar rashes, arthralgias, and sicca syndrome after starting methotrexate 15 mg [place] weekly. She reported not tolerating starting this medicine well due to nausea, although this symptom did not seem to correlate after stopping the methotrexate.  She continues experiencing joint pain in numerous sites her worst function limiting is currently in bilateral hip pain that affects her gait she feels she cannot tolerate much in abduction or abduction range of motion without severe pain exacerbation.     Previous HPI: 12/01/20 Jamie Holmes is a 61 y.o. female here for follow up for systemic lupus after recommending restart of hydroxychloroquine 300 mg PO daily for joint inflammation.  After starting the Plaquenil she did not notice any improvement in arthralgia symptoms and in fact felt worse all over.  She felt body aches and skin problems were exacerbated while taking the medication.  She discontinued this and feels her symptoms are just as bad now as before our last visit.   08/02/20 Jamie Holmes is a 61 y.o. female with a history of type 2 diabetes, asthma, GERD, TIA, CAD status post stent placement x3, peripheral vascular disease, lumbosacral radiculopathy, IBS, anxiety and depression here for follow up of systemic lupus. At last visit labs were checked demonstrating positive ANA without any other specific antibody titers. She continues to have  similar pain as her last visit worst in the bilateral knees with her AVN but also hand pain, swelling, and stiffness. Only new symptom is since 2 days ago RLQ abdominal pain that feels like a stabbing sensation and greatly decreased appetite. She denies blood in urine or stools, and has passed stools since the onset.   07/16/20 Jamie Holmes is a 61 y.o. female with a history of type 2 diabetes, asthma, GERD, TIA, CAD status post stent placement x3, peripheral vascular disease, lumbosacral radiculopathy, IBS, anxiety and depression here for evaluation and management of systemic lupus. She was originally diagnosed with lupus with what sounds like symptoms of malar rash, arthralgias, and sicca syndrome. She was treated at some point with prednisone which caused terrible adverse reactions and huge weight gain with striae. She took Plaquenil at some time does not recall exactly which dates but not since decades ago. 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 However earlier this month she right facial paresthesias and right arm and leg weakness with negative imaging workup felt to be consistent with TIA. Due to her history of multiple vascular problems and previous SLE not on treatment recommended to see for evaluation.   Currently describes intermittent parotid swelling, dry mouth and eyes, lymphadenopathy, raynaud's, arthralgias, and fatigue. No known history  of blood clots or renal disease. Previous labs 2017 showing positive ANA without lupus specific ENA profiles positive.   Labs reviewed 06/2020 ANA 1:320 homogenous dsDNa neg Complement C3 C4 wnl ACA neg B2GP neg LA confirmatory test neg ESR 33 CBC MCV 78.9, neutrophils 1402, eos 8, else unremarkable   07/2015 HIV neg   Previous lupus manifestations Arthralgias Sicca  syndrome Lymphadenopathy Raynaud's without gangrene Fatigue CAD Bilateral knee AVN   Previous lupus treatments HCQ   No Rheumatology ROS completed.   PMFS History:  Patient Active Problem List   Diagnosis Date Noted   Superficial venous thrombosis of arm 07/05/2021   High risk medication use 01/03/2021   Pain in left ankle and joints of left foot 12/01/2020   Right lower quadrant abdominal pain 08/02/2020   Osteoporosis 07/16/2020   GERD (gastroesophageal reflux disease) 07/16/2020   Mixed hyperlipidemia 04/12/2018   Stroke ([place]) 07/27/2017   Diabetic polyneuropathy associated with type 2 diabetes mellitus ([place]) 03/22/2017   Fibromyalgia 03/22/2017   Chest pain, unspecified 01/21/2017   Atherosclerotic heart disease of native coronary artery without angina pectoris 08/26/2016   Tremor 07/26/2015   Abnormality of gait 07/26/2015   Memory difficulty 07/26/2015   Constipation 08/11/2013   Neck pain 03/19/2013   Acquired unequal leg length 11/23/2012   Chronic liver disease 11/23/2012   Systemic lupus erythematosus, unspecified ([place]) 11/23/2012   Type 2 diabetes mellitus with diabetic polyneuropathy, with long-term current use of insulin ([place]) 11/23/2012   Insomnia 11/23/2012   Acquired hypothyroidism 11/23/2012   Asthma 08/21/2011   Hemiplegia affecting dominant side ([place]) 08/21/2011   Anxiety 06/19/2010   Interstitial cystitis 06/20/1999   Raynaud's disease 06/19/1993   Migraines 06/20/1979    Past Medical History:  Diagnosis Date   Avascular necrosis ([place])    Diabetes mellitus without complication ([place])    Fibromyalgia    Hypothyroidism    Interstitial cystitis    Liver disease    fatty liver; non-alcoholic   Lupus (systemic lupus erythematosus) ([place])    Osteoarthritis    Raynaud disease    Sjogren's disease ([place])    Thyroid disease     Family History  Problem Relation Age of Onset   Stroke Mother    Hypothyroidism Mother    High Cholesterol Mother     Heart disease Mother    Heart disease Father    Stroke Father    Heart attack Father    Arthritis Father    Diabetes Father    Diabetes Brother    Hyperthyroidism Brother    Heart disease Brother    Breast cancer Maternal Aunt        3 mat aunts with breast cancer   Heart disease Maternal Grandmother    Cancer Maternal Grandmother    Arthritis Maternal Grandmother    Diabetes Maternal Grandmother    Heart disease Maternal Grandfather    Cancer Maternal Grandfather    Arthritis Maternal Grandfather    Heart disease Paternal Grandmother    Arthritis Paternal Grandmother    Stroke Paternal Grandmother    Heart attack Paternal Grandmother    Diabetes Paternal Grandmother    Heart disease Paternal Grandfather    Arthritis Paternal Grandfather    Heart attack Paternal Grandfather    Diabetes Paternal Grandfather    Past Surgical History:  Procedure Laterality Date   ABDOMINAL HYSTERECTOMY     BILATERAL OOPHORECTOMY     bladder tack     BREAST SURGERY     lump,  right breast   heart stent  2018   NOSE SURGERY     Social History   Social History Narrative   Patient drinks 2 sodas weekly.   Patient is right handed.       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   Physical Exam   Musculoskeletal Exam: ***  CDAI Exam: CDAI Score: -- Patient Global: --; Provider Global: -- Swollen: --; Tender: -- Joint Exam 10/31/2021   No joint exam has been documented for this visit   There is currently no information documented on the homunculus. Go to the Rheumatology activity and complete the homunculus joint exam.  Investigation: No additional findings.  Imaging: No results found.  Recent  Labs: Lab Results  Component Value Date   WBC 4.1 07/16/2020   HGB 11.9 07/16/2020   PLT 224 07/16/2020   NA 138 07/07/2020   K 3.8 07/07/2020   CL 102 07/07/2020   CO2 25 07/07/2020   GLUCOSE 85 07/07/2020   BUN 10 07/07/2020   CREATININE 0.62 07/07/2020   BILITOT 0.4 07/07/2020   ALKPHOS 57 07/07/2020   AST 22 07/07/2020   ALT 19 07/07/2020   PROT 6.9 07/07/2020   ALBUMIN 3.8 07/07/2020   CALCIUM 9.5 07/07/2020   GFRAA >60 12/06/2019   QFTBGOLDPLUS NEGATIVE 01/03/2021    Speciality Comments: No specialty comments available.  Procedures:  No procedures performed Allergies: Atorvastatin, Ivp dye [iodinated contrast media], Other, Penicillins, and Prednisone   Assessment / Plan:     Visit Diagnoses: No diagnosis found.  ***  Orders: No orders of the defined types were placed in this encounter.  No orders of the defined types were placed in this encounter.    Follow-Up Instructions: No follow-ups on file.   Earnestine Mealing, CMA  Note - This record has been created using [employer].  Chart creation errors have been sought, but may not always  have been located. Such creation errors do not reflect on  the standard of medical care.

## 2021-10-31 ENCOUNTER — Ambulatory Visit: Payer: Commercial Managed Care - HMO | Admitting: Internal Medicine

## 2021-10-31 DIAGNOSIS — M329 Systemic lupus erythematosus, unspecified: Secondary | ICD-10-CM

## 2021-10-31 DIAGNOSIS — Z79899 Other long term (current) drug therapy: Secondary | ICD-10-CM

## 2021-11-07 ENCOUNTER — Other Ambulatory Visit: Payer: Self-pay

## 2021-11-07 ENCOUNTER — Emergency Department (HOSPITAL_COMMUNITY)
Admission: EM | Admit: 2021-11-07 | Discharge: 2021-11-08 | Disposition: A | Discharge status: Home / Self Care | Payer: Medicare Other | Attending: Emergency Medicine | Admitting: Emergency Medicine

## 2021-11-07 ENCOUNTER — Encounter (HOSPITAL_COMMUNITY): Payer: Self-pay | Admitting: Emergency Medicine

## 2021-11-07 DIAGNOSIS — Z794 Long term (current) use of insulin: Secondary | ICD-10-CM | POA: Diagnosis not present

## 2021-11-07 DIAGNOSIS — Z7982 Long term (current) use of aspirin: Secondary | ICD-10-CM | POA: Insufficient documentation

## 2021-11-07 DIAGNOSIS — R739 Hyperglycemia, unspecified: Secondary | ICD-10-CM

## 2021-11-07 DIAGNOSIS — E1165 Type 2 diabetes mellitus with hyperglycemia: Secondary | ICD-10-CM | POA: Insufficient documentation

## 2021-11-07 DIAGNOSIS — M545 Low back pain, unspecified: Secondary | ICD-10-CM | POA: Insufficient documentation

## 2021-11-07 DIAGNOSIS — R1031 Right lower quadrant pain: Secondary | ICD-10-CM | POA: Diagnosis present

## 2021-11-07 DIAGNOSIS — I2510 Atherosclerotic heart disease of native coronary artery without angina pectoris: Secondary | ICD-10-CM | POA: Insufficient documentation

## 2021-11-07 DIAGNOSIS — I700 Atherosclerosis of aorta: Secondary | ICD-10-CM | POA: Diagnosis not present

## 2021-11-07 DIAGNOSIS — E785 Hyperlipidemia, unspecified: Secondary | ICD-10-CM

## 2021-11-07 LAB — URINALYSIS, ROUTINE W REFLEX MICROSCOPIC
Bilirubin Urine: NEGATIVE
Glucose, UA: >=500 mg/dL — AB
Hgb urine dipstick: NEGATIVE
Ketones, ur: 80 mg/dL — AB
Leukocytes,Ua: NEGATIVE
Nitrite: NEGATIVE
Protein, ur: NEGATIVE mg/dL
Specific Gravity, Urine: 1.038 — ABNORMAL HIGH (ref 1.005–1.030)
pH: 6 (ref 5.0–8.0)

## 2021-11-07 LAB — CBC WITH DIFFERENTIAL/PLATELET
Abs Immature Granulocytes: 0 10*3/uL (ref 0.00–0.07)
Basophils Absolute: 0.2 10*3/uL — ABNORMAL HIGH (ref 0.0–0.1)
Basophils Relative: 3 %
Eosinophils Absolute: 0 10*3/uL (ref 0.0–0.5)
Eosinophils Relative: 0 %
Hemoglobin: 20.4 g/dL — ABNORMAL HIGH (ref 12.0–15.0)
Lymphocytes Relative: 52 %
Lymphs Abs: 3.4 10*3/uL (ref 0.7–4.0)
Monocytes Absolute: 0.3 10*3/uL (ref 0.1–1.0)
Monocytes Relative: 4 %
Neutro Abs: 2.7 10*3/uL (ref 1.7–7.7)
Neutrophils Relative %: 41 %
Platelets: 313 10*3/uL (ref 150–400)
WBC: 6.6 10*3/uL (ref 4.0–10.5)
nRBC: 0 % (ref 0.0–0.2)

## 2021-11-07 LAB — COMPREHENSIVE METABOLIC PANEL
ALT: 8 U/L (ref 0–44)
AST: 22 U/L (ref 15–41)
Albumin: 3.9 g/dL (ref 3.5–5.0)
Alkaline Phosphatase: 79 U/L (ref 38–126)
Anion gap: 11 (ref 5–15)
BUN: 22 mg/dL — ABNORMAL HIGH (ref 6–20)
CO2: 20 mmol/L — ABNORMAL LOW (ref 22–32)
Calcium: 9.1 mg/dL (ref 8.9–10.3)
Chloride: 100 mmol/L (ref 98–111)
Creatinine, Ser: 0.72 mg/dL (ref 0.44–1.00)
GFR, Estimated: >60 mL/min (ref >60)
Glucose, Bld: 381 mg/dL — ABNORMAL HIGH (ref 70–99)
Potassium: 4 mmol/L (ref 3.5–5.1)
Sodium: 131 mmol/L — ABNORMAL LOW (ref 135–145)
Total Bilirubin: 0.7 mg/dL (ref 0.3–1.2)
Total Protein: 7.3 g/dL (ref 6.5–8.1)

## 2021-11-07 LAB — CBG MONITORING, ED: Glucose-Capillary: 356 mg/dL — ABNORMAL HIGH (ref 70–99)

## 2021-11-07 LAB — LIPASE, BLOOD: Lipase: 27 U/L (ref 11–51)

## 2021-11-07 NOTE — ED Provider Triage Note (Signed)
Emergency Medicine Provider Triage Evaluation Note  Jamie Holmes , a 61 y.o. female  was evaluated in triage.  Pt complains of dizziness, abnormal labs.  For the past week she has been experiencing right lower abdominal pain that has gradually worsened.  Was seen and evaluated at PCPs office today, blood work was drawn and she was sent to the ER because "my blood had separated and looked like cottage cheese.  They told me it turned white and they have never seen anything like that before."  Her sugar was elevated to 500.  She went home and took her [place] and Ozempic and now feels dizzy.  She admits that she has not been compliant with her medication recently she has had frequent urination which is causing her to not be able to sleep and she feels "out of it."  Review of Systems  Positive: Abdominal pain, dizziness Negative: Chest pain, vomiting  Physical Exam  There were no vitals taken for this visit. Gen:   Awake, no distress   Resp:  Normal effort  MSK:   Moves extremities without difficulty  Other:  No aphasia.  Speaking complete senses without difficulty  Medical Decision Making  Medically screening exam initiated at 7:10 PM.  Appropriate orders placed.  Ethie Yanni was informed that the remainder of the evaluation will be completed by another provider, this initial triage assessment does not replace that evaluation, and the importance of remaining in the ED until their evaluation is complete.  Labs and cbg ordered   Delia Heady, Hershal Coria 11/07/21 1914

## 2021-11-07 NOTE — ED Triage Notes (Signed)
Patient BIB [facility] from home. Complaint of abnormal labs. Patient had blood drawn at dr today and dr said it "separated and was abnormal and she needed to go to the hospital."

## 2021-11-08 ENCOUNTER — Emergency Department (HOSPITAL_COMMUNITY): Payer: Medicare Other

## 2021-11-08 DIAGNOSIS — E1165 Type 2 diabetes mellitus with hyperglycemia: Secondary | ICD-10-CM | POA: Diagnosis not present

## 2021-11-08 MED ORDER — SODIUM CHLORIDE 0.9 % IV BOLUS: 1000.0000 mL | Freq: Once | INTRAVENOUS | Status: DC

## 2021-11-08 NOTE — ED Provider Notes (Signed)
[name] Provider Note   CSN: [id number] Arrival date & time: 11/07/21  1851     History  Chief Complaint  Patient presents with   abnormal labs    Jamie Holmes is a 61 y.o. female.  The history is provided by the patient and medical records.  Jamie Holmes is a 61 y.o. female who presents to the Emergency Department complaining of abnormal labs.  Today she went to her PCPs office for evaluation of 1 month of low back pain with excessive urination and excessive thirst.  She also reports 1 month of right lower quadrant abdominal pain that is worse when her cat pushes on this area.  When she went to her PCPs office today she had a urine performed that showed glucose, ketones and blood in her urine.  They then attempted to draw blood and there was a white at the top of the tube.  She does report forgetting to take her [place] and Ozempic this week.  She also reports poor sleep.  She does have a history of hyperlipidemia but is intolerant to all cholesterol medicines that she has tried to this point. No chest pain, vomiting, nausea, diarrhea, dysuria.  Home Medications Prior to Admission medications   Medication Sig Start Date End Date Taking? Authorizing Provider  ACCU-CHEK AVIVA PLUS test strip 1 each 3 (three) times daily. 07/09/20   [provider]  Accu-Chek Softclix Lancets lancets 1 each 3 (three) times daily. 05/05/20   [provider]  aspirin 81 MG EC tablet Take 1 tablet by mouth daily. 07/22/17   [provider]  azelastine (ASTELIN) 0.1 % nasal spray Place into both nostrils as needed. 07/07/20   [provider]  azithromycin (ZITHROMAX) 500 MG tablet Take 500 mg by mouth as needed. Patient not taking: Reported on 08/02/2020 04/10/20   [provider]  Blood Glucose Monitoring Suppl (ACCU-CHEK GUIDE ME) w/Device KIT  01/27/20   [provider]  busPIRone (BUSPAR) 5 MG tablet Take 5 mg  by mouth as needed (anxiety). 07/30/17   [provider]  clopidogrel (PLAVIX) 75 MG tablet Take 75 mg by mouth daily. 08/05/17   [provider]  diclofenac Sodium (VOLTAREN) 1 % GEL Apply 4 g topically as needed. 12/03/20   [provider]  DULoxetine (CYMBALTA) 60 MG capsule Take 60 mg by mouth daily. 11/18/19   [provider]  escitalopram (LEXAPRO) 20 MG tablet Take 20 mg by mouth daily.     [provider]  folic acid (FOLVITE) 1 MG tablet TAKE 1 TABLET BY MOUTH EVERY DAY Patient not taking: Reported on 07/05/2021 04/06/21   Collier Salina, MD  furosemide (LASIX) 20 MG tablet Take 20 mg by mouth 2 (two) times daily as needed. 12/05/20   [provider]  gabapentin (NEURONTIN) 300 MG capsule Take 300 mg by mouth See admin instructions. Take 2 capsules (681m) by mouth in the AM, 1 capsule (3062m by mouth in the afternoon, and 2 capsules (60064mby mouth at bedtime. 05/26/15   [provider]  HYDROcodone-acetaminophen (NORCO/VICODIN) 5-325 MG tablet Take 1 tablet by mouth in the morning, at noon, and at bedtime.    [provider]  Insulin Glargine (BASAGLAR KWIKPEN) 100 UNIT/ML SOPN Inject 66 Units into the skin daily.  Patient not taking: Reported on 07/16/2020 08/13/17   [provider]  levothyroxine (SYNTHROID) 137 MCG tablet Take 137 mcg by mouth daily. 12/06/20   [provider]  levothyroxine (SYNTHROID, LEVOTHROID) 150 MCG tablet Take 150 mcg by mouth daily before breakfast. Patient not taking: Reported on 01/03/2021    [provider]  lidocaine (LIDODERM) 5 % 1 patch daily. 12/05/20   [provider]  Lidocaine 4 % PTCH Apply 1 patch topically 3 (three) times daily as needed (pain). Patient not taking: Reported on 07/05/2021    [provider]  LINZESS 145 MCG CAPS capsule Take 290 mcg by mouth daily. 04/08/20   [provider]  methadone (DOLOPHINE) 10 MG tablet  Take 10 mg by mouth in the morning, at noon, and at bedtime.     [provider]  methotrexate 50 MG/2ML injection Inject 0.6 mLs (15 mg total) into the skin once a week. Patient not taking: Reported on 01/03/2021 12/01/20   Collier Salina, MD  nitroGLYCERIN (NITROSTAT) 0.4 MG SL tablet PLACE 1 TAB UNDER TONGUE EVERY 5 MINS AS NEEDED FOR CHEST PAIN.CALL 911 IF TAKE 2 DOSE*MAX 3/DAY 07/12/20   [provider]  OZEMPIC, 1 MG/DOSE, 2 MG/1.5ML SOPN Inject 0.75 mLs as directed once a week.  11/24/19   [provider]  pantoprazole (PROTONIX) 40 MG tablet Take 40 mg by mouth daily.    [provider]  polyethylene glycol powder (GLYCOLAX/MIRALAX) powder Take 17 g by mouth daily as needed for mild constipation or moderate constipation.  05/26/15   [provider]  pramipexole (MIRAPEX) 0.25 MG tablet Take 1-3 tablets by mouth daily. 11/04/20   [provider]  PROAIR HFA 108 (90 Base) MCG/ACT inhaler Inhale into the lungs. 07/11/20   [provider]  promethazine (PHENERGAN) 25 MG tablet Take 25 mg by mouth 4 (four) times daily as needed. 11/04/20   [provider]  promethazine (PHENERGAN) 25 MG tablet Take by mouth. Patient not taking: Reported on 01/03/2021    [provider]  RELISTOR 150 MG TABS Take 3 tablets by mouth as needed for constipation. 12/05/19   [provider]  REPATHA SURECLICK 956 MG/ML SOAJ Inject into the skin. 12/10/20   [provider]  SUMAtriptan (IMITREX) 100 MG tablet Take 100 mg by mouth as needed for migraine. 12/05/19   [provider]  terconazole (TERAZOL 7) 0.4 % vaginal cream Place 1 applicator vaginally as needed (irritation).  12/05/19   [provider]  topiramate (TOPAMAX) 50 MG tablet Take 50 mg by mouth 2 (two) times daily. 11/20/19   [provider]  traZODone (DESYREL) 100 MG tablet Take 300 mg by mouth at bedtime.    [provider]   TRESIBA FLEXTOUCH 200 UNIT/ML FlexTouch Pen Inject into the skin daily. 06/07/20   [provider]  TUBERCULIN SYR 1CC/26GX3/8" 26G X 3/8" 1 ML MISC Use once weekly for subcutaneous methotrexate injection 12/01/20   Rice, Resa Miner, MD  VOLTAREN 1 % GEL Apply 1 application topically 2 (two) times daily as needed (pain). 05/26/15   [provider]      Allergies    Atorvastatin, Ivp dye [iodinated contrast media], Other, Penicillins, and Prednisone    Review of Systems   Review of Systems  All other systems reviewed and are negative.  Physical Exam Updated Vital Signs BP (!) 144/79   Pulse 80   Temp 98.4 F (36.9 C)   Resp 12   SpO2 96%  Physical Exam Vitals and nursing note reviewed.  Constitutional:      Appearance: She is well-developed.  HENT:     Head:  Normocephalic and atraumatic.  Cardiovascular:     Rate and Rhythm: Normal rate and regular rhythm.     Heart sounds: No murmur heard. Pulmonary:     Effort: Pulmonary effort is normal. No respiratory distress.     Breath sounds: Normal breath sounds.  Abdominal:     Palpations: Abdomen is soft.     Tenderness: There is no guarding or rebound.     Comments: Mild right lower quadrant tenderness just superior to the inguinal crease  Musculoskeletal:        General: No tenderness.  Skin:    General: Skin is warm and dry.  Neurological:     Mental Status: She is alert and oriented to person, place, and time.  Psychiatric:        Behavior: Behavior normal.    ED Results / Procedures / Treatments   Labs (all labs ordered are listed, but only abnormal results are displayed) Labs Reviewed  COMPREHENSIVE METABOLIC PANEL - Abnormal; Notable for the following components:      Result Value   Sodium 131 (*)    CO2 20 (*)    Glucose, Bld 381 (*)    BUN 22 (*)    All other components within normal limits  CBC WITH DIFFERENTIAL/PLATELET - Abnormal; Notable for the following components:   Hemoglobin  20.4 (*)    Basophils Absolute 0.2 (*)    All other components within normal limits  URINALYSIS, ROUTINE W REFLEX MICROSCOPIC - Abnormal; Notable for the following components:   Color, Urine STRAW (*)    Specific Gravity, Urine 1.038 (*)    Glucose, UA >=500 (*)    Ketones, ur 80 (*)    Bacteria, UA RARE (*)    All other components within normal limits  CBG MONITORING, ED - Abnormal; Notable for the following components:   Glucose-Capillary 356 (*)    All other components within normal limits  LIPASE, BLOOD    EKG EKG Interpretation  Date/Time:  Monday Nov 07 2021 19:24:27 EDT Ventricular Rate:  91 PR Interval:  144 QRS Duration: 86 QT Interval:  356 QTC Calculation: 437 R Axis:   68 Text Interpretation: Normal sinus rhythm Normal ECG When compared with ECG of 07-Jul-2020 03:51, No significant change since last tracing Confirmed by Quintella Reichert [phone]) on 11/08/2021 12:07:28 AM  Radiology CT Renal Stone Study  Result Date: 11/08/2021 CLINICAL DATA:  Flank pain.  Concern for kidney stone. EXAM: CT ABDOMEN AND PELVIS WITHOUT CONTRAST TECHNIQUE: Multidetector CT imaging of the abdomen and pelvis was performed following the standard protocol without IV contrast. RADIATION DOSE REDUCTION: This exam was performed according to the departmental dose-optimization program which includes automated exposure control, adjustment of the mA and/or kV according to patient size and/or use of iterative reconstruction technique. COMPARISON:  CT of the chest abdomen pelvis dated 04/18/2021. FINDINGS: Evaluation of this exam is limited in the absence of intravenous contrast. Lower chest: The visualized lung bases are clear. There is coronary vascular calcification. No intra-abdominal free air or free fluid. Hepatobiliary: The liver is unremarkable. Cholecystectomy. There is mild pneumobilia. No retained calcified stone noted in the central CBD. Pancreas: Unremarkable. No pancreatic ductal dilatation or  surrounding inflammatory changes. Spleen: Small scattered splenic calcified granuloma. The spleen is otherwise unremarkable. Adrenals/Urinary Tract: The adrenal glands unremarkable. There is a 5 mm nonobstructing left renal inferior pole calculus. No hydronephrosis. The right kidney is unremarkable. The visualized ureters and urinary bladder appear unremarkable. Stomach/Bowel: There is moderate stool throughout  the colon. There is no bowel obstruction or active inflammation. The appendix is normal. Vascular/Lymphatic: Mild aortoiliac atherosclerotic disease. The IVC is unremarkable. No portal venous gas. There is no adenopathy. Reproductive: Hysterectomy.  No adnexal masses. Other: None Musculoskeletal: No acute or significant osseous findings. IMPRESSION: 1. A 5 mm nonobstructing left renal inferior pole calculus. No hydronephrosis. 2. No bowel obstruction. Normal appendix. 3. Aortic Atherosclerosis (ICD10-I70.0). Electronically Signed   By: Anner Crete M.D.   On: 11/08/2021 02:43    Procedures Procedures    Medications Ordered in ED Medications - No data to display  ED Course/ Medical Decision Making/ A&P                           Medical Decision Making Amount and/or Complexity of Data Reviewed Radiology: ordered.   Patient with history of diabetes, hyperlipidemia, coronary artery disease here for evaluation of abnormal labs on outpatient visit today.  Her labs today are significantly Lipe hemic, hemoglobin is elevated consistent with hemoconcentration.  BMP is significant for glucose of 381 and mild elevation in BUN.  Bicarb is mildly low but anion gap is within normal limits.  UA is concentrated.  No evidence of UTI.  Current clinical picture is not consistent with DKA or euglycemic DKA.  Discussed with patient that she is dehydrated, offered IV fluid hydration and she prefers oral hydration at this time.  CT was obtained given her complaints of back pain, urinary changes.  CT without  evidence of obstructing stone.  Discussed with patient incidental finding of nonobstructing left renal stone as well as atherosclerosis.  In terms of her lipemia, she does have ongoing hyperlipidemia but has been intolerant to lipid-lowering agents.  Discussed that she will need to follow-up with PCP/cardiology regarding alternative methods for lowering her lipids.  Discussed importance of oral fluid hydration.  In terms of her pain, unclear source, she may take Tylenol or ibuprofen available over-the-counter.  She may also consider Lidoderm cream or Voltaren gel.       Final Clinical Impression(s) / ED Diagnoses Final diagnoses:  Elevated lipids  Acute midline low back pain without sciatica  Atherosclerosis of aorta ([place])  Hyperglycemia    Rx / DC Orders ED Discharge Orders     None         Quintella Reichert, MD 11/08/21 [phone]

## 2021-11-16 NOTE — Progress Notes (Deleted)
Office Visit Note  Patient: Jamie Holmes             Date of Birth: 1960-09-22           MRN: [id number]             PCP: Barnetta Chapel, NP Referring: Barnetta Chapel, NP Visit Date: 11/28/2021   Subjective:  No chief complaint on file.   History of Present Illness: Jamie Holmes is a 61 y.o. female here for follow up for lupus with symptoms of malar rashes, arthralgias, and sicca syndrome after starting methotrexate 15 mg [place] weekly.   Previous HPI 01/03/2021 Jamie Holmes is a 61 y.o. female here for follow up for lupus with symptoms of malar rashes, arthralgias, and sicca syndrome after starting methotrexate 15 mg  weekly. She reported not tolerating starting this medicine well due to nausea, although this symptom did not seem to correlate after stopping the methotrexate.  She continues experiencing joint pain in numerous sites her worst function limiting is currently in bilateral hip pain that affects her gait she feels she cannot tolerate much in abduction or abduction range of motion without severe pain exacerbation.     Previous HPI: 12/01/20 Jamie Holmes is a 61 y.o. female here for follow up for systemic lupus after recommending restart of hydroxychloroquine 300 mg PO daily for joint inflammation.  After starting the Plaquenil she did not notice any improvement in arthralgia symptoms and in fact felt worse all over.  She felt body aches and skin problems were exacerbated while taking the medication.  She discontinued this and feels her symptoms are just as bad now as before our last visit.   08/02/20 Jamie Holmes is a 61 y.o. female with a history of type 2 diabetes, asthma, GERD, TIA, CAD status post stent placement x3, peripheral vascular disease, lumbosacral radiculopathy, IBS, anxiety and depression here for follow up of systemic lupus. At last visit labs were checked demonstrating positive ANA without any other specific antibody titers. She continues to have similar  pain as her last visit worst in the bilateral knees with her AVN but also hand pain, swelling, and stiffness. Only new symptom is since 2 days ago RLQ abdominal pain that feels like a stabbing sensation and greatly decreased appetite. She denies blood in urine or stools, and has passed stools since the onset.   07/16/20 Jamie Holmes is a 61 y.o. female with a history of type 2 diabetes, asthma, GERD, TIA, CAD status post stent placement x3, peripheral vascular disease, lumbosacral radiculopathy, IBS, anxiety and depression here for evaluation and management of systemic lupus. She was originally diagnosed with lupus with what sounds like symptoms of malar rash, arthralgias, and sicca syndrome. She was treated at some point with prednisone which caused terrible adverse reactions and huge weight gain with striae. She took Plaquenil at some time does not recall exactly which dates but not since decades ago. 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 However earlier this month she right facial paresthesias and right arm and leg weakness with negative imaging workup felt to be consistent with TIA. Due to her history of multiple vascular problems and previous SLE not on treatment recommended to see for evaluation.   Currently describes intermittent parotid swelling, dry mouth and eyes, lymphadenopathy, raynaud's, arthralgias, and fatigue. No known history of  blood clots or renal disease. Previous labs 2017 showing positive ANA without lupus specific ENA profiles positive.   Labs reviewed 06/2020 ANA 1:320 homogenous dsDNa neg Complement C3 C4 wnl ACA neg B2GP neg LA confirmatory test neg ESR 33 CBC MCV 78.9, neutrophils 1402, eos 8, else unremarkable   07/2015 HIV neg   Previous lupus manifestations Arthralgias Sicca  syndrome Lymphadenopathy Raynaud's without gangrene Fatigue CAD Bilateral knee AVN   Previous lupus treatments HCQ   No Rheumatology ROS completed.   PMFS History:  Patient Active Problem List   Diagnosis Date Noted   Superficial venous thrombosis of arm 07/05/2021   High risk medication use 01/03/2021   Pain in left ankle and joints of left foot 12/01/2020   Right lower quadrant abdominal pain 08/02/2020   Osteoporosis 07/16/2020   GERD (gastroesophageal reflux disease) 07/16/2020   Mixed hyperlipidemia 04/12/2018   Stroke ([place]) 07/27/2017   Diabetic polyneuropathy associated with type 2 diabetes mellitus ([place]) 03/22/2017   Fibromyalgia 03/22/2017   Chest pain, unspecified 01/21/2017   Atherosclerotic heart disease of native coronary artery without angina pectoris 08/26/2016   Tremor 07/26/2015   Abnormality of gait 07/26/2015   Memory difficulty 07/26/2015   Constipation 08/11/2013   Neck pain 03/19/2013   Acquired unequal leg length 11/23/2012   Chronic liver disease 11/23/2012   Systemic lupus erythematosus, unspecified ([place]) 11/23/2012   Type 2 diabetes mellitus with diabetic polyneuropathy, with long-term current use of insulin ([place]) 11/23/2012   Insomnia 11/23/2012   Acquired hypothyroidism 11/23/2012   Asthma 08/21/2011   Hemiplegia affecting dominant side ([place]) 08/21/2011   Anxiety 06/19/2010   Interstitial cystitis 06/20/1999   Raynaud's disease 06/19/1993   Migraines 06/20/1979    Past Medical History:  Diagnosis Date   Avascular necrosis ([place])    Diabetes mellitus without complication ([place])    Fibromyalgia    Hypothyroidism    Interstitial cystitis    Liver disease    fatty liver; non-alcoholic   Lupus (systemic lupus erythematosus) ([place])    Osteoarthritis    Raynaud disease    Sjogren's disease ([place])    Thyroid disease     Family History  Problem Relation Age of Onset   Stroke Mother    Hypothyroidism Mother    High Cholesterol Mother     Heart disease Mother    Heart disease Father    Stroke Father    Heart attack Father    Arthritis Father    Diabetes Father    Diabetes Brother    Hyperthyroidism Brother    Heart disease Brother    Breast cancer Maternal Aunt        3 mat aunts with breast cancer   Heart disease Maternal Grandmother    Cancer Maternal Grandmother    Arthritis Maternal Grandmother    Diabetes Maternal Grandmother    Heart disease Maternal Grandfather    Cancer Maternal Grandfather    Arthritis Maternal Grandfather    Heart disease Paternal Grandmother    Arthritis Paternal Grandmother    Stroke Paternal Grandmother    Heart attack Paternal Grandmother    Diabetes Paternal Grandmother    Heart disease Paternal Grandfather    Arthritis Paternal Grandfather    Heart attack Paternal Grandfather    Diabetes Paternal Grandfather    Past Surgical History:  Procedure Laterality Date   ABDOMINAL HYSTERECTOMY     BILATERAL OOPHORECTOMY     bladder tack     BREAST SURGERY     lump, right  breast   heart stent  2018   NOSE SURGERY     Social History   Social History Narrative   Patient drinks 2 sodas weekly.   Patient is right handed.       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   Physical Exam   Musculoskeletal Exam: ***  CDAI Exam: CDAI Score: -- Patient Global: --; Provider Global: -- Swollen: --; Tender: -- Joint Exam 11/28/2021   No joint exam has been documented for this visit   There is currently no information documented on the homunculus. Go to the Rheumatology activity and complete the homunculus joint exam.  Investigation: No additional findings.  Imaging: CT Renal Stone  Study  Result Date: 11/08/2021 CLINICAL DATA:  Flank pain.  Concern for kidney stone. EXAM: CT ABDOMEN AND PELVIS WITHOUT CONTRAST TECHNIQUE: Multidetector CT imaging of the abdomen and pelvis was performed following the standard protocol without IV contrast. RADIATION DOSE REDUCTION: This exam was performed according to the departmental dose-optimization program which includes automated exposure control, adjustment of the mA and/or kV according to patient size and/or use of iterative reconstruction technique. COMPARISON:  CT of the chest abdomen pelvis dated 04/18/2021. FINDINGS: Evaluation of this exam is limited in the absence of intravenous contrast. Lower chest: The visualized lung bases are clear. There is coronary vascular calcification. No intra-abdominal free air or free fluid. Hepatobiliary: The liver is unremarkable. Cholecystectomy. There is mild pneumobilia. No retained calcified stone noted in the central CBD. Pancreas: Unremarkable. No pancreatic ductal dilatation or surrounding inflammatory changes. Spleen: Small scattered splenic calcified granuloma. The spleen is otherwise unremarkable. Adrenals/Urinary Tract: The adrenal glands unremarkable. There is a 5 mm nonobstructing left renal inferior pole calculus. No hydronephrosis. The right kidney is unremarkable. The visualized ureters and urinary bladder appear unremarkable. Stomach/Bowel: There is moderate stool throughout the colon. There is no bowel obstruction or active inflammation. The appendix is normal. Vascular/Lymphatic: Mild aortoiliac atherosclerotic disease. The IVC is unremarkable. No portal venous gas. There is no adenopathy. Reproductive: Hysterectomy.  No adnexal masses. Other: None Musculoskeletal: No acute or significant osseous findings. IMPRESSION: 1. A 5 mm nonobstructing left renal inferior pole calculus. No hydronephrosis. 2. No bowel obstruction. Normal appendix. 3. Aortic Atherosclerosis (ICD10-I70.0). Electronically Signed    By: Anner Crete M.D.   On: 11/08/2021 02:43    Recent Labs: Lab Results  Component Value Date   WBC 6.6 11/07/2021   HGB 20.4 (H) 11/07/2021   PLT 313 11/07/2021   NA 131 (L) 11/07/2021   K 4.0 11/07/2021   CL 100 11/07/2021   CO2 20 (L) 11/07/2021   GLUCOSE 381 (H) 11/07/2021   BUN 22 (H) 11/07/2021   CREATININE 0.72 11/07/2021   BILITOT 0.7 11/07/2021   ALKPHOS 79 11/07/2021   AST 22 11/07/2021   ALT 8 11/07/2021   PROT 7.3 11/07/2021   ALBUMIN 3.9 11/07/2021   CALCIUM 9.1 11/07/2021   GFRAA >60 12/06/2019   QFTBGOLDPLUS NEGATIVE 01/03/2021    Speciality Comments: No specialty comments available.  Procedures:  No procedures performed Allergies: Atorvastatin, Ivp dye [iodinated contrast media], Other, Penicillins, and Prednisone   Assessment / Plan:     Visit Diagnoses: No diagnosis found.  ***  Orders: No orders of the defined types were placed in this encounter.  No orders of the defined types were placed in this encounter.    Follow-Up Instructions: No follow-ups on file.   Earnestine Mealing, CMA  Note - This record has been created using Editor, commissioning.  Chart creation errors have been sought, but may not always  have been located. Such creation errors do not reflect on  the standard of medical care.

## 2021-11-28 ENCOUNTER — Ambulatory Visit: Payer: Commercial Managed Care - HMO | Admitting: Internal Medicine

## 2021-11-28 DIAGNOSIS — M329 Systemic lupus erythematosus, unspecified: Secondary | ICD-10-CM

## 2021-11-28 DIAGNOSIS — Z79899 Other long term (current) drug therapy: Secondary | ICD-10-CM

## 2021-12-07 NOTE — Progress Notes (Signed)
Office Visit Note  Patient: Jamie Holmes             Date of Birth: 08-28-1960           MRN: [id number]             PCP: Barnetta Chapel, NP Referring: Barnetta Chapel, NP Visit Date: 12/08/2021   Subjective:   History of Present Illness: Jamie Holmes is a 61 y.o. female here for follow up for history of lupus currently off DMARD treatment. She is seeing pain management for chronic degenerative arthritis symptoms. Since seeing her last year has not seen a lot of specific lupus inflammation or rashes or joint swelling but much weaker after several complications including gangrenous cholecystitis also exacerbations of low back pain and radiculopathy. Right knee is worse than left but knees are both her most limiting symptoms at this time. Pain with weight bearing and resistance, and instability when walking. She is using a cane that helps partially. Not currently in PT, trying to walk when able on her own to stay moving but feels she pays for it days afterwards with FMS symptoms.   Previous HPI 01/03/21 Jamie Holmes is a 61 y.o. female here for follow up for lupus with symptoms of malar rashes, arthralgias, and sicca syndrome after starting methotrexate 15 mg Nemacolin weekly. She reported not tolerating starting this medicine well due to nausea, although this symptom did not seem to correlate after stopping the methotrexate.  She continues experiencing joint pain in numerous sites her worst function limiting is currently in bilateral hip pain that affects her gait she feels she cannot tolerate much in abduction or abduction range of motion without severe pain exacerbation.   Previous HPI: 12/01/20 Jamie Holmes is a 61 y.o. female here for follow up for systemic lupus after recommending restart of hydroxychloroquine 300 mg PO daily for joint inflammation.  After starting the Plaquenil she did not notice any improvement in arthralgia symptoms and in fact felt worse all over.  She felt body aches  and skin problems were exacerbated while taking the medication.  She discontinued this and feels her symptoms are just as bad now as before our last visit.   08/02/20 Jamie Holmes is a 61 y.o. female with a history of type 2 diabetes, asthma, GERD, TIA, CAD status post stent placement x3, peripheral vascular disease, lumbosacral radiculopathy, IBS, anxiety and depression here for follow up of systemic lupus. At last visit labs were checked demonstrating positive ANA without any other specific antibody titers. She continues to have similar pain as her last visit worst in the bilateral knees with her AVN but also hand pain, swelling, and stiffness. Only new symptom is since 2 days ago RLQ abdominal pain that feels like a stabbing sensation and greatly decreased appetite. She denies blood in urine or stools, and has passed stools since the onset.   07/16/20 Jamie Holmes is a 61 y.o. female with a history of type 2 diabetes, asthma, GERD, TIA, CAD status post stent placement x3, peripheral vascular disease, lumbosacral radiculopathy, IBS, anxiety and depression here for evaluation and management of systemic lupus. She was originally diagnosed with lupus with what sounds like symptoms of malar rash, arthralgias, and sicca syndrome. She was treated at some point with prednisone which caused terrible adverse reactions and huge weight gain with striae. She took Plaquenil at some time does not recall exactly which dates but not since decades ago. 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 However earlier this month she right facial paresthesias and right arm and leg weakness with negative imaging workup felt to be consistent with TIA. Due to her history of multiple vascular problems and previous SLE not on treatment recommended to see for  evaluation.   Currently describes intermittent parotid swelling, dry mouth and eyes, lymphadenopathy, raynaud's, arthralgias, and fatigue. No known history of blood clots or renal disease. Previous labs 2017 showing positive ANA without lupus specific ENA profiles positive.   Labs reviewed 06/2020 ANA 1:320 homogenous dsDNa neg Complement C3 C4 wnl ACA neg B2GP neg LA confirmatory test neg ESR 33 CBC MCV 78.9, neutrophils 1402, eos 8, else unremarkable   07/2015 HIV neg   Previous lupus manifestations Arthralgias Sicca syndrome Lymphadenopathy Raynaud's without gangrene Fatigue CAD Bilateral knee AVN   Previous lupus treatments HCQ   Review of Systems  Constitutional:  Positive for fatigue.  HENT:  Positive for mouth dryness.   Eyes:  Positive for dryness.  Respiratory:  Positive for shortness of breath.   Cardiovascular:  Positive for swelling in legs/feet.  Gastrointestinal:  Positive for constipation and diarrhea.  Endocrine: Positive for cold intolerance and heat intolerance.  Genitourinary:  Negative for difficulty urinating.  Musculoskeletal:  Positive for joint pain, gait problem, joint pain, joint swelling, muscle weakness, morning stiffness and muscle tenderness.  Skin:  Positive for rash.  Allergic/Immunologic: Positive for susceptible to infections.  Neurological:  Positive for numbness and weakness.  Hematological:  Positive for bruising/bleeding tendency.  Psychiatric/Behavioral:  Positive for sleep disturbance.     PMFS History:  Patient Active Problem List   Diagnosis Date Noted   Bilateral knee pain 12/08/2021   Superficial venous thrombosis of arm 07/05/2021   High risk medication use 01/03/2021   Pain in left ankle and joints of left foot 12/01/2020   Right lower quadrant abdominal pain 08/02/2020   Osteoporosis 07/16/2020   GERD (gastroesophageal reflux disease) 07/16/2020   Mixed hyperlipidemia 04/12/2018   Stroke ([place]) 07/27/2017    Diabetic polyneuropathy associated with type 2 diabetes mellitus ([place]) 03/22/2017   Fibromyalgia 03/22/2017   Chest pain, unspecified 01/21/2017   Atherosclerotic heart disease of native coronary artery without angina pectoris 08/26/2016   Tremor 07/26/2015   Abnormality of gait 07/26/2015   Memory difficulty 07/26/2015   Constipation 08/11/2013   Neck pain 03/19/2013   Acquired unequal leg length 11/23/2012   Chronic liver disease 11/23/2012   Systemic lupus erythematosus, unspecified ([place]) 11/23/2012   Type 2 diabetes mellitus with diabetic polyneuropathy, with long-term current use of insulin ([place]) 11/23/2012   Insomnia 11/23/2012   Acquired hypothyroidism 11/23/2012   Asthma 08/21/2011   Hemiplegia affecting dominant side ([place]) 08/21/2011   Anxiety 06/19/2010   Interstitial cystitis 06/20/1999   Raynaud's disease 06/19/1993   Migraines 06/20/1979    Past Medical History:  Diagnosis Date   Avascular necrosis ([place])    Diabetes mellitus without complication ([place])    Fibromyalgia    Hypothyroidism    Interstitial cystitis    Liver disease    fatty liver; non-alcoholic   Lupus (systemic lupus erythematosus) ([place])    Osteoarthritis    Raynaud disease    Sjogren's disease ([place])    Thyroid disease     Family History  Problem Relation Age of Onset   Stroke Mother    Hypothyroidism Mother  High Cholesterol Mother    Heart disease Mother    Heart disease Father    Stroke Father    Heart attack Father    Arthritis Father    Diabetes Father    Diabetes Brother    Hyperthyroidism Brother    Heart disease Brother    Breast cancer Maternal Aunt        3 mat aunts with breast cancer   Heart disease Maternal Grandmother    Cancer Maternal Grandmother    Arthritis Maternal Grandmother    Diabetes Maternal Grandmother    Heart disease Maternal Grandfather    Cancer Maternal Grandfather    Arthritis Maternal Grandfather    Heart disease Paternal Grandmother    Arthritis  Paternal Grandmother    Stroke Paternal Grandmother    Heart attack Paternal Grandmother    Diabetes Paternal Grandmother    Heart disease Paternal Grandfather    Arthritis Paternal Grandfather    Heart attack Paternal Grandfather    Diabetes Paternal Grandfather    Past Surgical History:  Procedure Laterality Date   ABDOMINAL HYSTERECTOMY     BILATERAL OOPHORECTOMY     bladder tack     BREAST SURGERY     lump, right breast   heart stent  2018   NOSE SURGERY     Social History   Social History Narrative   Patient drinks 2 sodas weekly.   Patient is right handed.       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  Cardiovascular:     Rate and Rhythm: Normal rate and regular rhythm.  Pulmonary:     Effort: Pulmonary effort is normal.     Breath sounds: Normal breath sounds.  Musculoskeletal:     Right lower leg: No edema.     Left lower leg: No edema.  Skin:    General: Skin is warm and dry.     Findings: No rash.  Neurological:     Mental Status: She is alert.  Psychiatric:        Mood and Affect: Mood normal.      Musculoskeletal Exam: Shoulders full ROM no tenderness or swelling Elbows full ROM no tenderness or swelling Wrists full ROM no tenderness or swelling Fingers full ROM no tenderness or swelling Bilateral knee crepitus ROM is intact Right knee anterior tenderness and positive patellar grind, also medial joint line tenderness to pressure, pain with resisted extension Left knee no pain  with extension, joint line tenderness but no anterior or patellar pressure symptoms    Investigation: No additional findings.  Imaging: No results found.  Recent Labs: Lab Results  Component Value Date   WBC 6.6 11/07/2021   HGB 20.4 (H) 11/07/2021   PLT 313 11/07/2021   NA 131 (L) 11/07/2021   K 4.0 11/07/2021   CL 100 11/07/2021   CO2 20 (L) 11/07/2021   GLUCOSE 381 (H) 11/07/2021   BUN 22 (H) 11/07/2021  CREATININE 0.72 11/07/2021   BILITOT 0.7 11/07/2021   ALKPHOS 79 11/07/2021   AST 22 11/07/2021   ALT 8 11/07/2021   PROT 7.3 11/07/2021   ALBUMIN 3.9 11/07/2021   CALCIUM 9.1 11/07/2021   GFRAA >60 12/06/2019   QFTBGOLDPLUS NEGATIVE 01/03/2021    Speciality Comments: No specialty comments available.  Procedures:  No procedures performed Allergies: Atorvastatin, Ivp dye [iodinated contrast media], Other, Penicillins, and Prednisone   Assessment / Plan:     Visit Diagnoses: Raynaud's disease without gangrene Systemic lupus erythematosus, unspecified SLE type, unspecified organ involvement status ([place])  Symptoms are actually doing pretty well at this time appears to be maintaining remission or low disease activity off immunosuppressive medication.  Still has Raynaud's symptoms no ischemic injury or major complications since last follow-up.  Current knee pains appear more degenerative than inflammatory so I do not think it is an indication to resume DMR medication at this time.  Chronic pain of both knees - Plan: XR KNEE 3 VIEW RIGHT, XR KNEE 3 VIEW LEFT  Bilateral knee pain good range of movement but with crepitus and anterior pain on patellar grind and reported symptoms we will check bilateral x-rays are consistent with osteoarthritis changes.  I think she would benefit to see physical therapy as a neck step.  She is already established with pain management for chronic back pain issues.   Orders: Orders Placed This Encounter  Procedures   XR KNEE 3 VIEW RIGHT    XR KNEE 3 VIEW LEFT   No orders of the defined types were placed in this encounter.    Follow-Up Instructions: No follow-ups on file.   Collier Salina, MD  Note - This record has been created using [employer].  Chart creation errors have been sought, but may not always  have been located. Such creation errors do not reflect on  the standard of medical care.

## 2021-12-08 ENCOUNTER — Ambulatory Visit (INDEPENDENT_AMBULATORY_CARE_PROVIDER_SITE_OTHER): Payer: Medicare Other

## 2021-12-08 ENCOUNTER — Encounter: Payer: Self-pay | Admitting: Internal Medicine

## 2021-12-08 ENCOUNTER — Ambulatory Visit (INDEPENDENT_AMBULATORY_CARE_PROVIDER_SITE_OTHER): Payer: Medicare Other | Admitting: Internal Medicine

## 2021-12-08 VITALS — BP 103/77 | HR 93 | Resp 12 | Ht 63.0 in | Wt 133.0 lb

## 2021-12-08 DIAGNOSIS — M25561 Pain in right knee: Secondary | ICD-10-CM

## 2021-12-08 DIAGNOSIS — I7300 Raynaud's syndrome without gangrene: Secondary | ICD-10-CM | POA: Diagnosis not present

## 2021-12-08 DIAGNOSIS — M329 Systemic lupus erythematosus, unspecified: Secondary | ICD-10-CM

## 2021-12-08 DIAGNOSIS — Z79899 Other long term (current) drug therapy: Secondary | ICD-10-CM | POA: Diagnosis not present

## 2021-12-08 DIAGNOSIS — G8929 Other chronic pain: Secondary | ICD-10-CM

## 2021-12-08 DIAGNOSIS — M25562 Pain in left knee: Secondary | ICD-10-CM

## 2021-12-08 HISTORY — DX: Pain in left knee: M25.561

## 2021-12-09 ENCOUNTER — Telehealth: Payer: Self-pay | Admitting: Internal Medicine

## 2021-12-09 DIAGNOSIS — M179 Osteoarthritis of knee, unspecified: Secondary | ICD-10-CM

## 2022-03-22 IMAGING — CT CT HEAD CODE STROKE
4 series · 17 of 47 positions shown, 19 images · non-contrast
Comparison: Previous MRI from 09/18/2015.

CLINICAL DATA: Code stroke.

EXAM:
CT HEAD WITHOUT CONTRAST
TECHNIQUE: Contiguous axial images were obtained from the base of the skull
through the vertex without intravenous contrast.

[Series 2: head wo · axial · 0.48mm/px · z∈[-103,+17]mm · 7 of 33 slices shown, 9 images]
[im 5/33  brain]
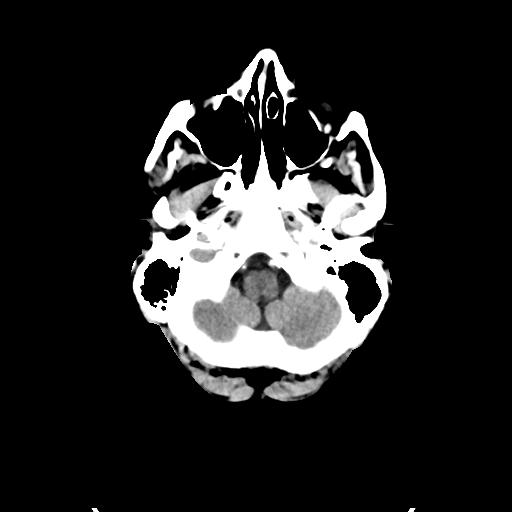
[im 5/33  bone]
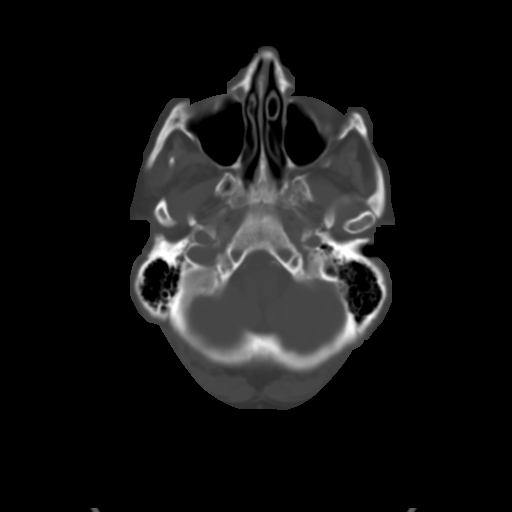
[im 9/33  brain]
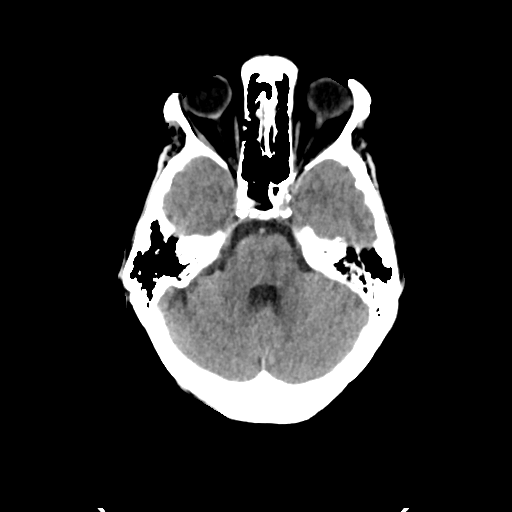
[im 13/33  brain]
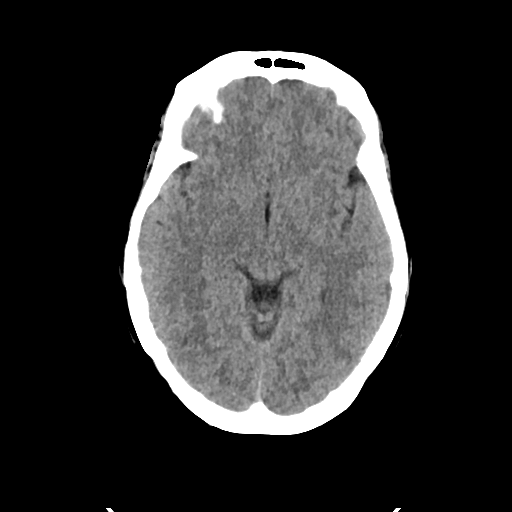
[im 17/33  brain]
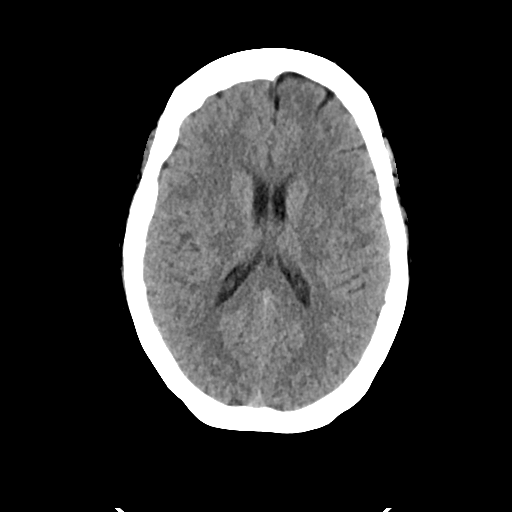
[im 21/33  brain]
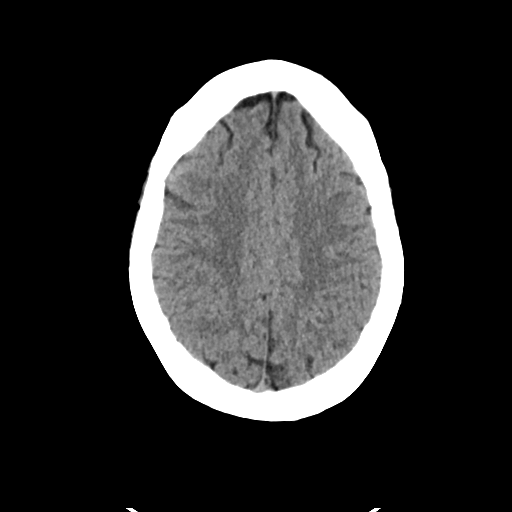
[im 21/33  bone]
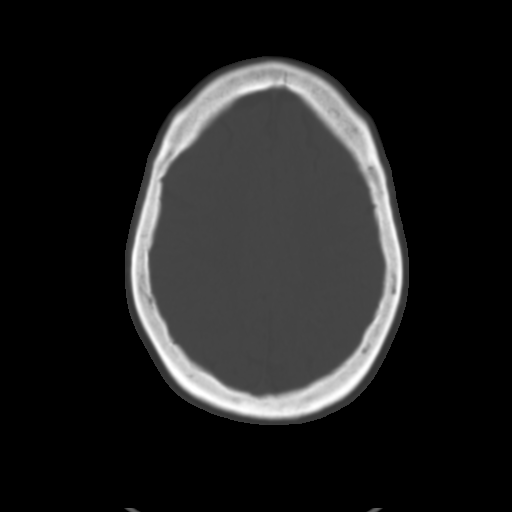
[im 25/33  brain]
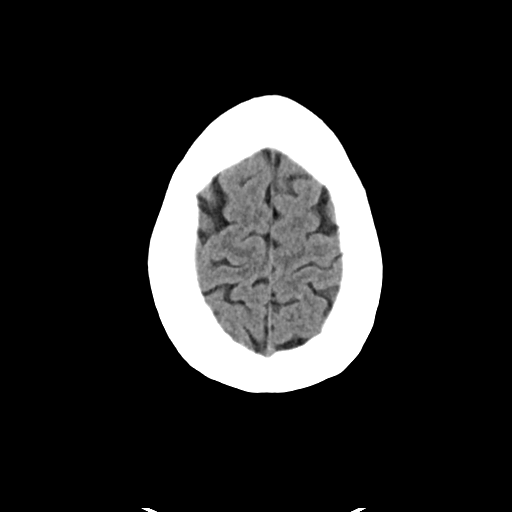
[im 29/33  brain]
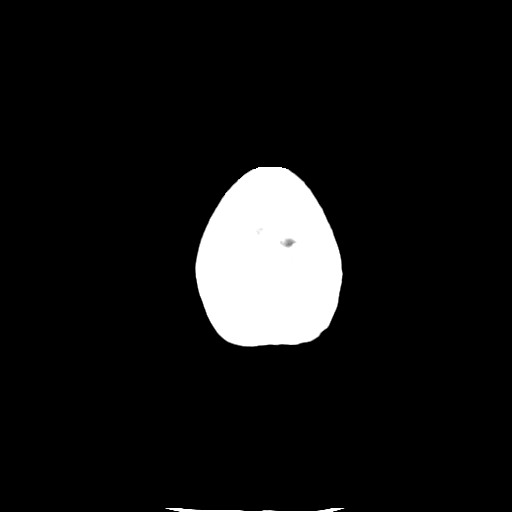

[Series 4: head bone · axial · 0.48mm/px · z∈[-107,-51]mm · 4 of 83 slices shown]
[im 9/83  bone]
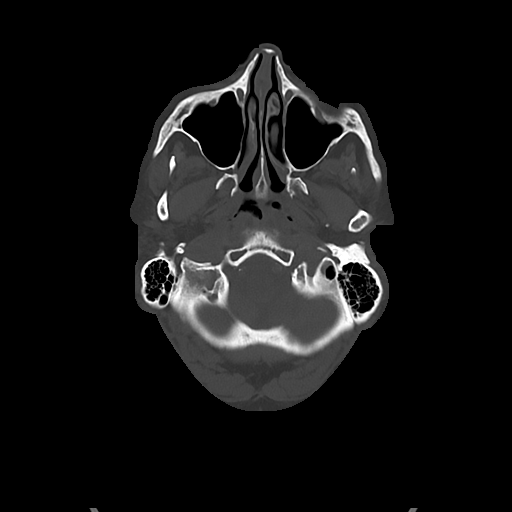
[im 17/83  bone]
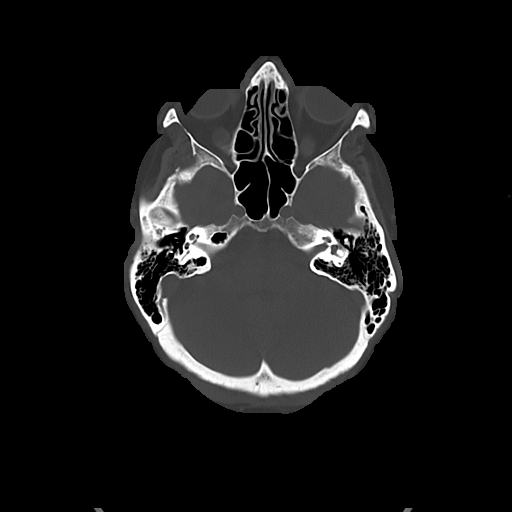
[im 25/83  bone]
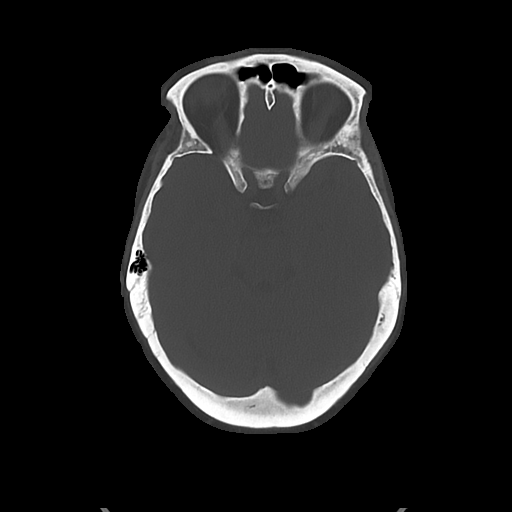
[im 37/83  bone]
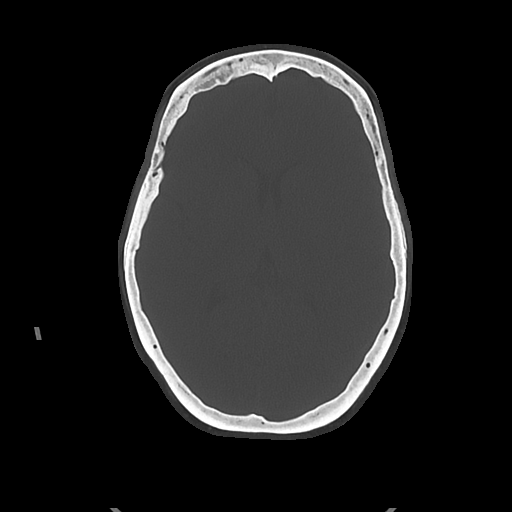

[Series 5: cor soft · coronal · 0.33mm/px · 3 of 71 slices shown]
[im 24/71  brain]
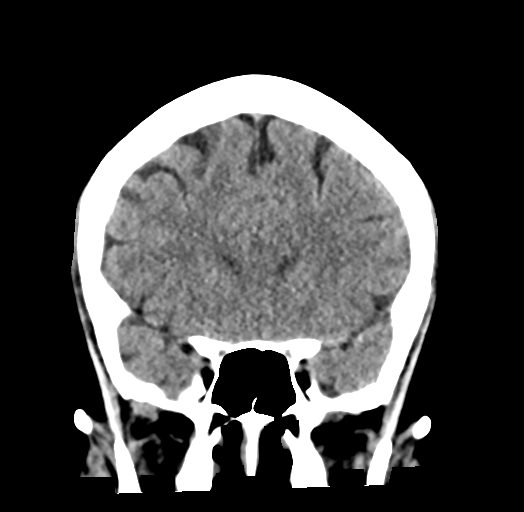
[im 32/71  brain]
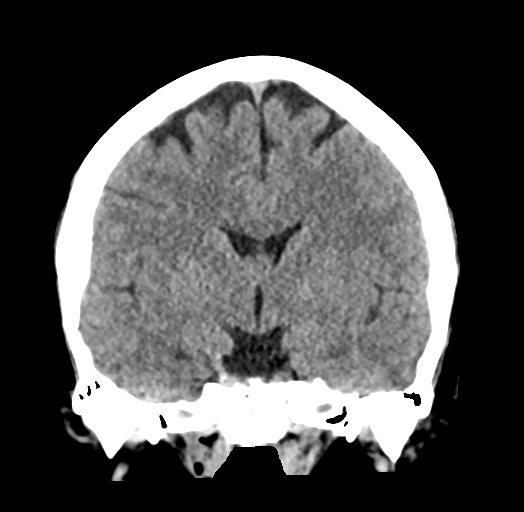
[im 39/71  brain]
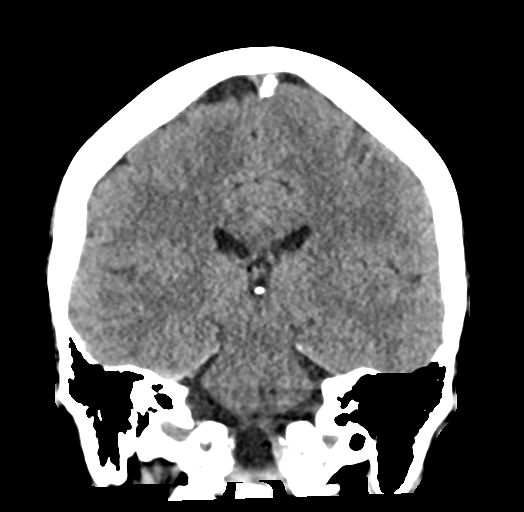

[Series 6: sag soft · sagittal · 0.34mm/px · 3 of 57 slices shown]
[im 19/57  brain]
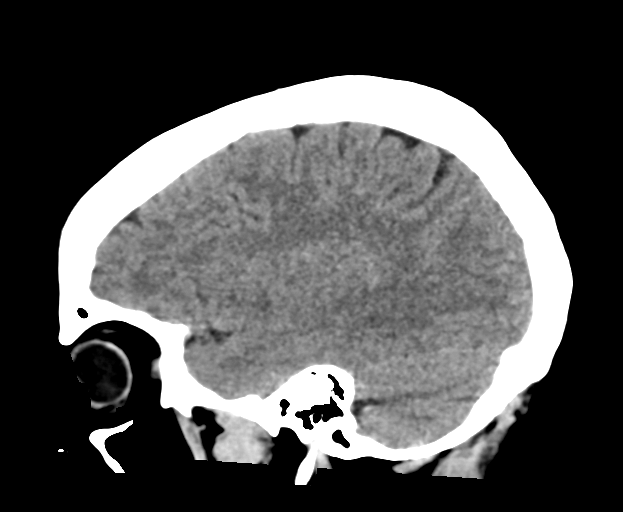
[im 29/57  brain]
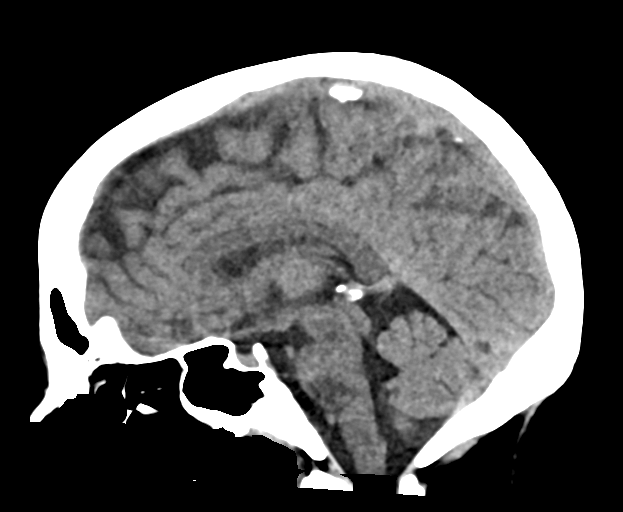
[im 38/57  brain]
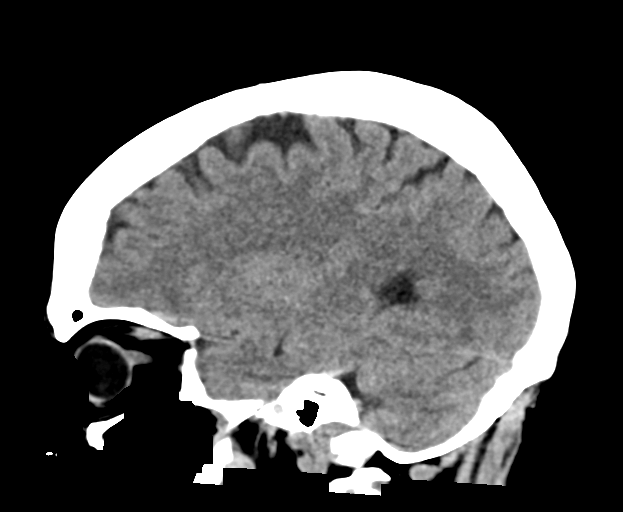

[17 of 47 positions shown; findings below may reference images not displayed]

FINDINGS: Brain: Cerebral volume within normal limits. No acute intracranial
hemorrhage. No acute large vessel territory infarct. No mass lesion,
midline shift or mass effect. No hydrocephalus or extra-axial fluid
collection.

Vascular: No hyperdense vessel.

Skull: Scalp soft tissues and calvarium within normal limits.

Sinuses/Orbits: Globes and orbital soft tissues demonstrate no acute
finding. Paranasal sinuses and mastoid air cells are clear.

Other: None.

ASPECTS (Alberta Stroke Program Early CT Score)

- Ganglionic level infarction (caudate, lentiform nuclei, internal
capsule, insula, M1-M3 cortex): 7

- Supraganglionic infarction (M4-M6 cortex): 3

Total score (0-10 with 10 being normal): 10
IMPRESSION: 1. Negative head CT.  No acute intracranial abnormality.
2. ASPECTS is 10.

These results were communicated to Dr. Stevens at [DATE] [place]
07/07/2020by text page via the [employer] messaging system.

## 2022-09-08 ENCOUNTER — Other Ambulatory Visit: Payer: Self-pay

## 2022-09-08 DIAGNOSIS — E119 Type 2 diabetes mellitus without complications: Secondary | ICD-10-CM | POA: Insufficient documentation

## 2022-09-08 DIAGNOSIS — M8700 Idiopathic aseptic necrosis of unspecified bone: Secondary | ICD-10-CM | POA: Insufficient documentation

## 2022-09-08 DIAGNOSIS — E079 Disorder of thyroid, unspecified: Secondary | ICD-10-CM | POA: Insufficient documentation

## 2022-09-08 DIAGNOSIS — M1990 Unspecified osteoarthritis, unspecified site: Secondary | ICD-10-CM | POA: Insufficient documentation

## 2022-09-08 DIAGNOSIS — M3500 Sicca syndrome, unspecified: Secondary | ICD-10-CM | POA: Insufficient documentation

## 2022-10-11 ENCOUNTER — Ambulatory Visit: Payer: No Typology Code available for payment source | Attending: Cardiology | Admitting: Cardiology

## 2022-10-17 ENCOUNTER — Encounter: Payer: Self-pay | Admitting: Cardiology

## 2023-08-22 ENCOUNTER — Emergency Department (HOSPITAL_COMMUNITY)
Admission: EM | Admit: 2023-08-22 | Discharge: 2023-08-22 | Disposition: A | Discharge status: Home / Self Care | Attending: Emergency Medicine | Admitting: Emergency Medicine

## 2023-08-22 ENCOUNTER — Encounter (HOSPITAL_COMMUNITY)
Admission: EM | Disposition: A | Discharge status: Home / Self Care | Payer: Self-pay | Source: Home / Self Care | Attending: Emergency Medicine

## 2023-08-22 ENCOUNTER — Encounter (HOSPITAL_COMMUNITY): Payer: Self-pay | Admitting: Anesthesiology

## 2023-08-22 ENCOUNTER — Other Ambulatory Visit: Payer: Self-pay

## 2023-08-22 ENCOUNTER — Emergency Department (HOSPITAL_COMMUNITY)

## 2023-08-22 ENCOUNTER — Encounter (HOSPITAL_COMMUNITY): Payer: Self-pay

## 2023-08-22 DIAGNOSIS — Z95828 Presence of other vascular implants and grafts: Secondary | ICD-10-CM | POA: Diagnosis not present

## 2023-08-22 DIAGNOSIS — R55 Syncope and collapse: Secondary | ICD-10-CM

## 2023-08-22 DIAGNOSIS — Z8673 Personal history of transient ischemic attack (TIA), and cerebral infarction without residual deficits: Secondary | ICD-10-CM | POA: Diagnosis not present

## 2023-08-22 DIAGNOSIS — R519 Headache, unspecified: Secondary | ICD-10-CM | POA: Insufficient documentation

## 2023-08-22 DIAGNOSIS — I2510 Atherosclerotic heart disease of native coronary artery without angina pectoris: Secondary | ICD-10-CM | POA: Insufficient documentation

## 2023-08-22 DIAGNOSIS — R200 Anesthesia of skin: Secondary | ICD-10-CM | POA: Diagnosis present

## 2023-08-22 DIAGNOSIS — Z794 Long term (current) use of insulin: Secondary | ICD-10-CM | POA: Insufficient documentation

## 2023-08-22 DIAGNOSIS — Z7982 Long term (current) use of aspirin: Secondary | ICD-10-CM | POA: Insufficient documentation

## 2023-08-22 DIAGNOSIS — J45909 Unspecified asthma, uncomplicated: Secondary | ICD-10-CM | POA: Insufficient documentation

## 2023-08-22 DIAGNOSIS — E119 Type 2 diabetes mellitus without complications: Secondary | ICD-10-CM | POA: Insufficient documentation

## 2023-08-22 LAB — COMPREHENSIVE METABOLIC PANEL
ALT: 17 U/L (ref 0–44)
AST: 24 U/L (ref 15–41)
Albumin: 4.2 g/dL (ref 3.5–5.0)
Alkaline Phosphatase: 83 U/L (ref 38–126)
Anion gap: 15 (ref 5–15)
BUN: 8 mg/dL (ref 8–23)
CO2: 23 mmol/L (ref 22–32)
Calcium: 9.7 mg/dL (ref 8.9–10.3)
Chloride: 106 mmol/L (ref 98–111)
Creatinine, Ser: 0.94 mg/dL (ref 0.44–1.00)
GFR, Estimated: >60 mL/min (ref >60)
Glucose, Bld: 222 mg/dL — ABNORMAL HIGH (ref 70–99)
Potassium: 3.7 mmol/L (ref 3.5–5.1)
Sodium: 144 mmol/L (ref 135–145)
Total Bilirubin: 0.5 mg/dL (ref 0.0–1.2)
Total Protein: 7.9 g/dL (ref 6.5–8.1)

## 2023-08-22 LAB — I-STAT CHEM 8, ED
BUN: 7 mg/dL — ABNORMAL LOW (ref 8–23)
Calcium, Ion: 1.17 mmol/L (ref 1.15–1.40)
Chloride: 107 mmol/L (ref 98–111)
Creatinine, Ser: 0.8 mg/dL (ref 0.44–1.00)
Glucose, Bld: 213 mg/dL — ABNORMAL HIGH (ref 70–99)
HCT: 41 % (ref 36.0–46.0)
Hemoglobin: 13.9 g/dL (ref 12.0–15.0)
Potassium: 3.6 mmol/L (ref 3.5–5.1)
Sodium: 143 mmol/L (ref 135–145)
TCO2: 25 mmol/L (ref 22–32)

## 2023-08-22 LAB — URINALYSIS, ROUTINE W REFLEX MICROSCOPIC
Bilirubin Urine: NEGATIVE
Glucose, UA: >=500 mg/dL — AB
Hgb urine dipstick: NEGATIVE
Ketones, ur: NEGATIVE mg/dL
Leukocytes,Ua: NEGATIVE
Nitrite: NEGATIVE
Protein, ur: NEGATIVE mg/dL
Specific Gravity, Urine: 1.022 (ref 1.005–1.030)
pH: 5 (ref 5.0–8.0)

## 2023-08-22 LAB — DIFFERENTIAL
Abs Immature Granulocytes: 0.01 10*3/uL (ref 0.00–0.07)
Basophils Absolute: 0 10*3/uL (ref 0.0–0.1)
Basophils Relative: 1 %
Eosinophils Absolute: 0 10*3/uL (ref 0.0–0.5)
Eosinophils Relative: 1 %
Immature Granulocytes: 0 %
Lymphocytes Relative: 44 %
Lymphs Abs: 1.8 10*3/uL (ref 0.7–4.0)
Monocytes Absolute: 0.3 10*3/uL (ref 0.1–1.0)
Monocytes Relative: 7 %
Neutro Abs: 2 10*3/uL (ref 1.7–7.7)
Neutrophils Relative %: 47 %

## 2023-08-22 LAB — CBC
HCT: 43.2 % (ref 36.0–46.0)
Hemoglobin: 13.2 g/dL (ref 12.0–15.0)
MCH: 24.6 pg — ABNORMAL LOW (ref 26.0–34.0)
MCHC: 30.6 g/dL (ref 30.0–36.0)
MCV: 80.6 fL (ref 80.0–100.0)
Platelets: 302 10*3/uL (ref 150–400)
RBC: 5.36 MIL/uL — ABNORMAL HIGH (ref 3.87–5.11)
RDW: 15.2 % (ref 11.5–15.5)
WBC: 4.2 10*3/uL (ref 4.0–10.5)
nRBC: 0 % (ref 0.0–0.2)

## 2023-08-22 LAB — RAPID URINE DRUG SCREEN, HOSP PERFORMED
Amphetamines: POSITIVE — AB
Barbiturates: NOT DETECTED
Benzodiazepines: NOT DETECTED
Cocaine: NOT DETECTED
Opiates: NOT DETECTED
Tetrahydrocannabinol: POSITIVE — AB

## 2023-08-22 LAB — PROTIME-INR
INR: 1 (ref 0.8–1.2)
Prothrombin Time: 13.5 s (ref 11.4–15.2)

## 2023-08-22 LAB — APTT: aPTT: 28 s (ref 24–36)

## 2023-08-22 LAB — CBG MONITORING, ED: Glucose-Capillary: 201 mg/dL — ABNORMAL HIGH (ref 70–99)

## 2023-08-22 SURGERY — RADIOLOGY WITH ANESTHESIA
Anesthesia: Choice

## 2023-08-22 MED ORDER — LORAZEPAM 2 MG/ML IJ SOLN
1.0000 mg | Freq: Once | INTRAMUSCULAR | Status: AC
  Administered 2023-08-22: 1 mg via INTRAVENOUS
  Filled 2023-08-22: qty 1

## 2023-08-22 MED ORDER — GABAPENTIN 300 MG PO CAPS
300.0000 mg | ORAL_CAPSULE | Freq: Once | ORAL | Status: AC
  Administered 2023-08-22: 300 mg via ORAL
  Filled 2023-08-22: qty 1

## 2023-08-22 NOTE — Code Documentation (Signed)
 Stroke Response Nurse Documentation Code Documentation  Kayly Kriegel is a 63 y.o. female arriving to [facility]  via [facility] on 08/22/2023. Code stroke was activated by EMS.   Patient from home where she was LKW at 1630 and now complaining of headache and right sided weakness. Pt endorses having a headache earlier today. She was witnessed to have an unresponsive episode lasting approximately 10 minutes.   Stroke team at the bedside on patient arrival. Labs drawn and patient cleared for CT by Dr. Jeraldine Loots. Patient to CT with team.   NIHSS 5, see documentation for details and code stroke times. Patient with right arm weakness, right leg weakness, right limb ataxia, and right decreased sensation on exam.   The following imaging was completed:  CT Head.   Patient is not a candidate for IV Thrombolytic due to symptoms improving. Patient is not a candidate for IR due to evaluation negative for LVO.   Care Plan: q2h NIHSS and VS  Bedside handoff with ED RN D' Erika.    Deborha Moseley L Christana Angelica  Rapid Response RN

## 2023-08-22 NOTE — ED Provider Notes (Signed)
 [place] EMERGENCY DEPARTMENT AT [facility] Provider Note   CSN: [id number] Arrival date & time: 08/22/23  1191  An emergency department physician performed an initial assessment on this suspected stroke patient at 1820.  History  Chief Complaint  Patient presents with   Code Stroke    Jamie Holmes is a 63 y.o. female.  HPI Patient presents as a code stroke.  She arrives via EMS.  Patient recalls having a headache earlier in the day, not taking medication for the past few days for unclear reasons, but otherwise being in her usual state of health.  She was then found unresponsive on the lawn, reportedly for 10 minutes.  In transport the patient was alert, awake, complaining of right arm numbness.  Patient notes that she has been seen, evaluated, several times recently at different hospitals, she perseverates on how she is going to get home.    Home Medications Prior to Admission medications   Medication Sig Start Date End Date Taking? Authorizing Provider  Albuterol Sulfate, sensor, (PROAIR DIGIHALER) 108 (90 Base) MCG/ACT AEPB Inhale into the lungs. 04/13/21  Yes [provider]  azelastine (ASTELIN) 0.1 % nasal spray Place into both nostrils as needed. 07/07/20  Yes [provider]  diclofenac Sodium (VOLTAREN) 1 % GEL Apply 4 g topically as needed (pain). 12/03/20  Yes [provider]  DULoxetine (CYMBALTA) 60 MG capsule Take 60 mg by mouth daily. 11/18/19  Yes [provider]  EPINEPHrine (ADRENALIN) 1 MG/ML SOLN injection Inject 1 mg into the skin as needed for anaphylaxis.   Yes [provider]  escitalopram (LEXAPRO) 20 MG tablet Take 20 mg by mouth daily.    Yes [provider]  famotidine (PEPCID) 40 MG tablet Take 40 mg by mouth daily.   Yes [provider]  fluticasone (FLONASE) 50 MCG/ACT nasal spray Place 1 spray into both nostrils every 12 (twelve) hours as needed for allergies or rhinitis.   Yes  [provider]  gabapentin (NEURONTIN) 800 MG tablet Take 800 mg by mouth 3 (three) times daily.   Yes [provider]  levothyroxine (SYNTHROID) 137 MCG tablet Take 137 mcg by mouth daily. 12/06/20  Yes [provider]  lidocaine (LIDODERM) 5 % 1 patch daily. 12/05/20  Yes [provider]  LINZESS 145 MCG CAPS capsule Take 290 mcg by mouth daily. 04/08/20  Yes [provider]  nitroGLYCERIN (NITROSTAT) 0.4 MG SL tablet PLACE 1 TAB UNDER TONGUE EVERY 5 MINS AS NEEDED FOR CHEST PAIN.CALL 911 IF TAKE 2 DOSE*MAX 3/DAY 07/12/20  Yes [provider]  OZEMPIC, 1 MG/DOSE, 2 MG/1.5ML SOPN Inject 2 mg as directed once a week. 11/24/19  Yes [provider]  pantoprazole (PROTONIX) 40 MG tablet Take 40 mg by mouth 2 (two) times daily.   Yes [provider]  pramipexole (MIRAPEX) 0.75 MG tablet Take 0.75 mg by mouth at bedtime.   Yes [provider]  promethazine (PHENERGAN) 25 MG tablet Take 25 mg by mouth every 8 (eight) hours as needed for vomiting or nausea.   Yes [provider]  tiZANidine (ZANAFLEX) 4 MG capsule Take 4 mg by mouth at bedtime.   Yes [provider]  traZODone (DESYREL) 100 MG tablet Take 300 mg by mouth at bedtime.   Yes [provider]  TRESIBA FLEXTOUCH 200 UNIT/ML FlexTouch Pen Inject 120 Units into the skin daily at 10 pm. 06/07/20  Yes [provider]  ACCU-CHEK AVIVA PLUS test strip  1 each 3 (three) times daily. 07/09/20   [provider]  Accu-Chek Softclix Lancets lancets 1 each 3 (three) times daily. 05/05/20   [provider]  acetaminophen (TYLENOL) 650 MG CR tablet Take 650 mg by mouth every 6 (six) hours as needed for pain. Patient not taking: Reported on 08/22/2023    [provider]  aspirin 81 MG EC tablet Take 1 tablet by mouth daily. Patient not taking: Reported on 12/08/2021 07/22/17   [provider]  Blood Glucose Monitoring  Suppl (ACCU-CHEK GUIDE ME) w/Device KIT  01/27/20   [provider]  busPIRone (BUSPAR) 5 MG tablet Take 5 mg by mouth as needed (anxiety). 07/30/17   [provider]  cetirizine (ZYRTEC) 10 MG tablet Take 10 mg by mouth daily. Patient not taking: Reported on 08/22/2023    [provider]  clopidogrel (PLAVIX) 75 MG tablet Take 75 mg by mouth daily. 08/05/17   [provider]  diphenhydrAMINE (BENADRYL) 25 MG tablet Take 25 mg by mouth 3 (three) times daily as needed for allergies. Patient not taking: Reported on 08/22/2023    [provider]  hydrocortisone 1 % lotion Apply 1 Application topically as needed for itching (or rash). Patient not taking: Reported on 08/22/2023    [provider]  Insulin Glargine (BASAGLAR KWIKPEN) 100 UNIT/ML SOPN Inject 66 Units into the skin daily.  Patient not taking: Reported on 07/16/2020 08/13/17   [provider]  levothyroxine (SYNTHROID) 125 MCG tablet Take 125 mcg by mouth daily before breakfast. Patient not taking: Reported on 08/22/2023    [provider]  Lidocaine 4 % PTCH Apply 1 patch topically 3 (three) times daily as needed (pain). Patient not taking: Reported on 08/22/2023    [provider]  loperamide (IMODIUM A-D) 2 MG tablet Take 2 mg by mouth as needed for diarrhea or loose stools. Patient not taking: Reported on 08/22/2023    [provider]  meloxicam (MOBIC) 15 MG tablet Take 15 mg by mouth daily. Patient not taking: Reported on 08/22/2023    [provider]  naloxone [facility]) nasal spray 4 mg/0.1 mL Place 1 spray into the nose as needed (opoid reversal).    [provider]  polyethylene glycol powder (GLYCOLAX/MIRALAX) powder Take 17 g by mouth daily as needed for mild constipation or moderate constipation.  Patient not taking: Reported on 08/22/2023 05/26/15   [provider]  RELISTOR 150 MG TABS Take 3 tablets by mouth as needed for  constipation. Patient not taking: Reported on 12/08/2021 12/05/19   [provider]  REPATHA SURECLICK 140 MG/ML SOAJ Inject into the skin. Patient not taking: Reported on 12/08/2021 12/10/20   [provider]  SUMAtriptan (IMITREX) 100 MG tablet Take 100 mg by mouth as needed for migraine. Patient not taking: Reported on 08/22/2023 12/05/19   [provider]  topiramate (TOPAMAX) 100 MG tablet Take 100 mg by mouth 2 (two) times daily.    [provider]  topiramate (TOPAMAX) 50 MG tablet Take 50 mg by mouth 2 (two) times daily. 11/20/19   [provider]      Allergies    Atorvastatin, Ivp dye [iodinated contrast media], Other, Penicillins, Prednisone, and Statins    Review of Systems   Review of Systems  Physical Exam Updated Vital Signs BP (!) 143/78   Pulse 71   Temp 98.8 F (37.1 C) (Temporal)   Resp 15   Ht 5\' 3"  (1.6 m)  SpO2 98%   BMI 23.67 kg/m  Physical Exam Vitals and nursing note reviewed.  Constitutional:      General: She is not in acute distress.    Appearance: She is well-developed.  HENT:     Head: Normocephalic and atraumatic.  Eyes:     Conjunctiva/sclera: Conjunctivae normal.  Cardiovascular:     Rate and Rhythm: Normal rate and regular rhythm.  Pulmonary:     Effort: Pulmonary effort is normal. No respiratory distress.     Breath sounds: Normal breath sounds. No stridor.  Abdominal:     General: There is no distension.  Skin:    General: Skin is warm and dry.  Neurological:     Mental Status: She is alert and oriented to person, place, and time.     Cranial Nerves: No cranial nerve deficit or dysarthria.  Psychiatric:        Mood and Affect: Mood is anxious.     ED Results / Procedures / Treatments   Labs (all labs ordered are listed, but only abnormal results are displayed) Labs Reviewed  CBC - Abnormal; Notable for the following components:      Result Value   RBC 5.36 (*)    MCH 24.6 (*)    All  other components within normal limits  COMPREHENSIVE METABOLIC PANEL - Abnormal; Notable for the following components:   Glucose, Bld 222 (*)    All other components within normal limits  RAPID URINE DRUG SCREEN, HOSP PERFORMED - Abnormal; Notable for the following components:   Amphetamines POSITIVE (*)    Tetrahydrocannabinol POSITIVE (*)    All other components within normal limits  URINALYSIS, ROUTINE W REFLEX MICROSCOPIC - Abnormal; Notable for the following components:   Glucose, UA >=500 (*)    Bacteria, UA RARE (*)    All other components within normal limits  I-STAT CHEM 8, ED - Abnormal; Notable for the following components:   BUN 7 (*)    Glucose, Bld 213 (*)    All other components within normal limits  CBG MONITORING, ED - Abnormal; Notable for the following components:   Glucose-Capillary 201 (*)    All other components within normal limits  PROTIME-INR  APTT  DIFFERENTIAL  ETHANOL    EKG EKG Interpretation Date/Time:  Wednesday August 22 2023 18:30:54 EST Ventricular Rate:  71 PR Interval:  146 QRS Duration:  99 QT Interval:  405 QTC Calculation: 441 R Axis:   70  Text Interpretation: Sinus rhythm Borderline T wave abnormalities Confirmed by Gerhard Munch [phone]) on 08/22/2023 6:45:20 PM  Radiology MR BRAIN WO CONTRAST Result Date: 08/22/2023 CLINICAL DATA:  Neuro deficit, acute, stroke suspected EXAM: MRI HEAD WITHOUT CONTRAST TECHNIQUE: Multiplanar, multiecho pulse sequences of the brain and surrounding structures were obtained without intravenous contrast. COMPARISON:  Same day CT head. FINDINGS: Brain: No acute infarction, hemorrhage, hydrocephalus, extra-axial collection or mass lesion. Vascular: Normal flow voids. Skull and upper cervical spine: Normal marrow signal. Sinuses/Orbits: Negative. Other: No mastoid effusions. IMPRESSION: No evidence of acute intracranial abnormality. Electronically Signed   By: Feliberto Harts M.D.   On: 08/22/2023 22:55   CT  HEAD CODE STROKE WO CONTRAST Result Date: 08/22/2023 CLINICAL DATA:  Code stroke. Neuro deficit, acute, stroke suspected. Weakness. Headache. Leg drop. Numbness. EXAM: CT HEAD WITHOUT CONTRAST TECHNIQUE: Contiguous axial images were obtained from the base of the skull through the vertex without intravenous contrast. RADIATION DOSE REDUCTION: This exam was performed according to the departmental dose-optimization program which  includes automated exposure control, adjustment of the mA and/or kV according to patient size and/or use of iterative reconstruction technique. COMPARISON:  Head CT 04/24/2023.  Brain MRI 09/18/2015. FINDINGS: Brain: Cerebral volume is normal. There is no acute intracranial hemorrhage. No demarcated cortical infarct. No extra-axial fluid collection. No evidence of an intracranial mass. No midline shift. Vascular: No hyperdense vessel.  Atherosclerotic calcifications. Skull: No calvarial fracture or aggressive osseous lesion. Sinuses/Orbits: No mass or acute finding within the imaged orbits. No significant paranasal sinus disease at the imaged levels. ASPECTS [facility] Stroke Program Early CT Score) - Ganglionic level infarction (caudate, lentiform nuclei, internal capsule, insula, M1-M3 cortex): 7 - Supraganglionic infarction (M4-M6 cortex): 3 Total score (0-10 with 10 being normal): 10 No evidence of an acute intracranial abnormality. These results were communicated to Dr. Amada Jupiter at 6:25 pmon 3/5/2025by text page via the [facility] messaging system. IMPRESSION: No evidence of an acute intracranial abnormality. Electronically Signed   By: Jackey Loge D.O.   On: 08/22/2023 18:25    Procedures Procedures    Medications Ordered in ED Medications  gabapentin (NEURONTIN) capsule 300 mg (300 mg Oral Given 08/22/23 1958)  LORazepam (ATIVAN) injection 1 mg (1 mg Intravenous Given 08/22/23 1957)    ED Course/ Medical Decision Making/ A&P                                 Medical Decision  Making Adult female presents as a code stroke after being found unresponsive, complaining of headache prior to that, and with right arm numbness.  On exam she is awake, alert, has no focal neurodeficits.  Differential including TIA versus CVA versus seizure discussed with our neurology colleagues with whom management was contemporaneous.  Cardiac 85 sinus normal Pulse ox 100% room air normal   Amount and/or Complexity of Data Reviewed External Data Reviewed: notes. Labs: ordered. Decision-making details documented in ED Course. Radiology: ordered and independent interpretation performed. Decision-making details documented in ED Course. ECG/medicine tests: independent interpretation performed. Decision-making details documented in ED Course.  Risk Prescription drug management. Decision regarding hospitalization.   11:07 PM Patient awakens easily, is in no distress.  I have previously discussed her presentation again with neurology, discussed plan, with MRI results that are normal, CT normal, labs unremarkable aside from amphetamine positive, THC positive results, patient has labs that are noncontributory and she is appropriate for close outpatient follow-up.        Final Clinical Impression(s) / ED Diagnoses Final diagnoses:  Numbness    Rx / DC Orders ED Discharge Orders     None         Gerhard Munch, MD 08/22/23 2307

## 2023-08-22 NOTE — ED Notes (Signed)
 When patient is discharged, please call Maretta Los @ [phone] to provide ride home for patient. You will need to give address and ask for total to place on voucher. This was approved by TOCCheri Rous

## 2023-08-22 NOTE — Discharge Instructions (Signed)
 As discussed, your evaluation today has been largely reassuring.  But, it is important that you monitor your condition carefully, and do not hesitate to return to the ED if you develop new, or concerning changes in your condition. ? ?Otherwise, please follow-up with your physician for appropriate ongoing care. ? ?

## 2023-08-22 NOTE — Consult Note (Signed)
 NEUROLOGY CONSULT NOTE   Date of service: August 22, 2023 Patient Name: Islam Villescas MRN:  [id number] DOB:  [birth year] Chief Complaint: "Passing out" Requesting Provider: Gerhard Munch, MD  History of Present Illness  Massie Cogliano is a 63 y.o. female with hx of perioperative stroke per patient report when she had a stent placed who was in her normal state of health earlier today.  Around 1630, she complained of having a sudden onset headache with subsequent loss of consciousness.  A bystander police officer happened upon her and found her to be unresponsive to sternal rub.  She has gradually come around, but had right-sided weakness on her arousal and therefore code stroke was activated.  This has continued to improve but is still present to some degree.  Of note, she has a history of several episodes where she was told that she had "seizure-like activity."  In the past.  She has also had multiple episodes of lost time for which she cannot account does not think she fell asleep.  LKW: 1630 Modified rankin score: 1-No significant post stroke disability and can perform usual duties with stroke symptoms IV Thrombolysis: No, mild symptoms EVT: No, mild symptoms  NIHSS components Score: Comment  1a Level of Conscious 0[x]  1[]  2[]  3[]      1b LOC Questions 0[x]  1[]  2[]       1c LOC Commands 0[x]  1[]  2[]       2 Best Gaze 0[x]  1[]  2[]       3 Visual 0[x]  1[]  2[]  3[]      4 Facial Palsy 0[x]  1[]  2[]  3[]      5a Motor Arm - left 0[x]  1[]  2[]  3[]  4[]  UN[]    5b Motor Arm - Right 0[]  1[x]  2[]  3[]  4[]  UN[]    6a Motor Leg - Left 0[x]  1[]  2[]  3[]  4[]  UN[]    6b Motor Leg - Right 0[]  1[x]  2[]  3[]  4[]  UN[]    7 Limb Ataxia 0[x]  1[]  2[]  3[]  UN[]     8 Sensory 0[]  1[x]  2[]  UN[]      9 Best Language 0[x]  1[]  2[]  3[]      10 Dysarthria 0[x]  1[]  2[]  UN[]      11 Extinct. and Inattention 0[x]  1[]  2[]       TOTAL: 3       Past History   Past Medical History:  Diagnosis Date   Abnormality of gait 07/26/2015    Acquired hypothyroidism 11/23/2012   Last Assessment & Plan:   Formatting of this note might be different from the original.  Onset after prednisone started for SLE, she reports prednisone allergy now.  Continue current dose levothyroxine  Check labs   Acquired unequal leg length 11/23/2012   Anxiety 06/19/2010   Arthropathy of lumbar facet joint 02/03/2021   Formatting of this note might be different from the original. Added automatically from request for surgery [id number]   Asthma 08/21/2011   Atherosclerotic heart disease of native coronary artery without angina pectoris 08/26/2016   Formatting of this note might be different from the original.  Added automatically from request for surgery [phone]   Avascular necrosis (HCC)    Bilateral knee pain 12/08/2021   Chest pain, unspecified 01/21/2017   Formatting of this note might be different from the original.  Added automatically from request for surgery [phone]   Chronic liver disease 11/23/2012   Last Assessment & Plan:   Formatting of this note might be different from the original.  Patient reports history of non-alcoholic liver diseases  Asks for HCV test,  has seen the commercial   Constipation 08/11/2013   Diabetes mellitus without complication (HCC)    Diabetic polyneuropathy associated with type 2 diabetes mellitus (HCC) 03/22/2017   Fibromyalgia    GERD (gastroesophageal reflux disease) 07/16/2020   Hemiplegia affecting dominant side (HCC) 08/21/2011   High risk medication use 01/03/2021   Insomnia 11/23/2012   Interstitial cystitis    Memory difficulty 07/26/2015   Migraines 06/20/1979   Mixed hyperlipidemia 04/12/2018   Formatting of this note might be different from the original.  Added automatically from request for surgery [id number]   Neck pain 03/19/2013   Osteoarthritis    Osteoporosis 07/16/2020   Pain in left ankle and joints of left foot 12/01/2020   Raynaud's disease 06/19/1993   Right lower quadrant abdominal pain  08/02/2020   Sjogren's disease (HCC)    Stroke (HCC) 07/27/2017   Superficial venous thrombosis of arm 07/05/2021   Systemic lupus erythematosus, unspecified (HCC) 11/23/2012   Thyroid disease    Tremor 07/26/2015   Type 2 diabetes mellitus with diabetic polyneuropathy, with long-term current use of insulin (HCC) 11/23/2012   Last Assessment & Plan:   Formatting of this note might be different from the original.  Poorly controlled but sounds like her readings are getting better since adding insulin and Invokana and stopping 9 can/day soft drinks.  Check A1c with further adjustments pending results  The goal is for the Hgb A1C to be less than 7.0.  It is recommended that all diabetics follow a healthy diabetic diet, exe    Past Surgical History:  Procedure Laterality Date   ABDOMINAL HYSTERECTOMY     BILATERAL OOPHORECTOMY     bladder tack     BREAST SURGERY     lump, right breast   heart stent  2018   NOSE SURGERY      Family History: Family History  Problem Relation Age of Onset   Stroke Mother    Hypothyroidism Mother    High Cholesterol Mother    Heart disease Mother    Heart disease Father    Stroke Father    Heart attack Father    Arthritis Father    Diabetes Father    Diabetes Brother    Hyperthyroidism Brother    Heart disease Brother    Breast cancer Maternal Aunt        3 mat aunts with breast cancer   Heart disease Maternal Grandmother    Cancer Maternal Grandmother    Arthritis Maternal Grandmother    Diabetes Maternal Grandmother    Heart disease Maternal Grandfather    Cancer Maternal Grandfather    Arthritis Maternal Grandfather    Heart disease Paternal Grandmother    Arthritis Paternal Grandmother    Stroke Paternal Grandmother    Heart attack Paternal Grandmother    Diabetes Paternal Grandmother    Heart disease Paternal Grandfather    Arthritis Paternal Grandfather    Heart attack Paternal Grandfather    Diabetes Paternal Grandfather      Social History  reports that she has never smoked. She has been exposed to tobacco smoke. She has never used smokeless tobacco. She reports that she does not drink alcohol and does not use drugs.    Medications   Current Facility-Administered Medications:    gabapentin (NEURONTIN) capsule 300 mg, 300 mg, Oral, Once, Gerhard Munch, MD   LORazepam (ATIVAN) injection 1 mg, 1 mg, Intravenous, Once, Gerhard Munch, MD  Current Outpatient Medications:    ACCU-CHEK AVIVA PLUS  test strip, 1 each 3 (three) times daily., Disp: , Rfl:    Accu-Chek Softclix Lancets lancets, 1 each 3 (three) times daily., Disp: , Rfl:    acetaminophen (TYLENOL) 650 MG CR tablet, Take 650 mg by mouth every 6 (six) hours as needed for pain., Disp: , Rfl:    aspirin 81 MG EC tablet, Take 1 tablet by mouth daily. (Patient not taking: Reported on 12/08/2021), Disp: , Rfl:    azelastine (ASTELIN) 0.1 % nasal spray, Place into both nostrils as needed., Disp: , Rfl:    azithromycin (ZITHROMAX) 500 MG tablet, Take 500 mg by mouth as needed. (Patient not taking: Reported on 08/02/2020), Disp: , Rfl:    Blood Glucose Monitoring Suppl (ACCU-CHEK GUIDE ME) w/Device KIT, , Disp: , Rfl:    busPIRone (BUSPAR) 5 MG tablet, Take 5 mg by mouth as needed (anxiety)., Disp: , Rfl:    cetirizine (ZYRTEC) 10 MG tablet, Take 10 mg by mouth daily., Disp: , Rfl:    clopidogrel (PLAVIX) 75 MG tablet, Take 75 mg by mouth daily., Disp: , Rfl: 3   diclofenac Sodium (VOLTAREN) 1 % GEL, Apply 4 g topically as needed (pain)., Disp: , Rfl:    diphenhydrAMINE (BENADRYL) 25 MG tablet, Take 25 mg by mouth 3 (three) times daily as needed for allergies., Disp: , Rfl:    DULoxetine (CYMBALTA) 60 MG capsule, Take 60 mg by mouth daily. (Patient not taking: Reported on 12/08/2021), Disp: , Rfl:    EPINEPHrine (ADRENALIN) 1 MG/ML SOLN injection, Inject 1 mg into the skin as needed for anaphylaxis., Disp: , Rfl:    escitalopram (LEXAPRO) 20 MG tablet, Take  20 mg by mouth daily. , Disp: , Rfl:    famotidine (PEPCID) 40 MG tablet, Take 40 mg by mouth daily., Disp: , Rfl:    fluticasone (FLONASE) 50 MCG/ACT nasal spray, Place 1 spray into both nostrils every 12 (twelve) hours as needed for allergies or rhinitis., Disp: , Rfl:    furosemide (LASIX) 20 MG tablet, Take 20 mg by mouth 2 (two) times daily as needed., Disp: , Rfl:    gabapentin (NEURONTIN) 800 MG tablet, Take 800 mg by mouth 3 (three) times daily., Disp: , Rfl:    hydrocortisone 1 % lotion, Apply 1 Application topically as needed for itching (or rash)., Disp: , Rfl:    Insulin Glargine (BASAGLAR KWIKPEN) 100 UNIT/ML SOPN, Inject 66 Units into the skin daily.  (Patient not taking: Reported on 07/16/2020), Disp: , Rfl: 3   levothyroxine (SYNTHROID) 125 MCG tablet, Take 125 mcg by mouth daily before breakfast., Disp: , Rfl:    levothyroxine (SYNTHROID) 137 MCG tablet, Take 137 mcg by mouth daily. (Patient not taking: Reported on 12/08/2021), Disp: , Rfl:    lidocaine (LIDODERM) 5 %, 1 patch daily., Disp: , Rfl:    Lidocaine 4 % PTCH, Apply 1 patch topically 3 (three) times daily as needed (pain)., Disp: , Rfl:    LINZESS 145 MCG CAPS capsule, Take 290 mcg by mouth daily., Disp: , Rfl:    loperamide (IMODIUM A-D) 2 MG tablet, Take 2 mg by mouth as needed for diarrhea or loose stools., Disp: , Rfl:    meloxicam (MOBIC) 15 MG tablet, Take 15 mg by mouth daily., Disp: , Rfl:    naloxone (NARCAN) nasal spray 4 mg/0.1 mL, Place 1 spray into the nose as needed (opoid reversal)., Disp: , Rfl:    nitroGLYCERIN (NITROSTAT) 0.4 MG SL tablet, PLACE 1 TAB UNDER TONGUE EVERY 5  MINS AS NEEDED FOR CHEST PAIN.CALL 911 IF TAKE 2 DOSE*MAX 3/DAY, Disp: , Rfl:    OZEMPIC, 1 MG/DOSE, 2 MG/1.5ML SOPN, Inject 2 mg as directed once a week., Disp: , Rfl:    pantoprazole (PROTONIX) 40 MG tablet, Take 40 mg by mouth 2 (two) times daily., Disp: , Rfl:    polyethylene glycol powder (GLYCOLAX/MIRALAX) powder, Take 17 g by mouth  daily as needed for mild constipation or moderate constipation. , Disp: , Rfl:    pramipexole (MIRAPEX) 0.25 MG tablet, Take 1-3 tablets by mouth daily., Disp: , Rfl:    pramipexole (MIRAPEX) 0.75 MG tablet, Take 0.75 mg by mouth at bedtime., Disp: , Rfl:    PROAIR HFA 108 (90 Base) MCG/ACT inhaler, Inhale into the lungs., Disp: , Rfl:    promethazine (PHENERGAN) 25 MG tablet, Take 25 mg by mouth 4 (four) times daily as needed for vomiting or nausea., Disp: , Rfl:    promethazine (PHENERGAN) 25 MG tablet, Take by mouth., Disp: , Rfl:    RELISTOR 150 MG TABS, Take 3 tablets by mouth as needed for constipation. (Patient not taking: Reported on 12/08/2021), Disp: , Rfl:    REPATHA SURECLICK 140 MG/ML SOAJ, Inject into the skin. (Patient not taking: Reported on 12/08/2021), Disp: , Rfl:    SUMAtriptan (IMITREX) 100 MG tablet, Take 100 mg by mouth as needed for migraine., Disp: , Rfl:    terconazole (TERAZOL 7) 0.4 % vaginal cream, Place 1 applicator vaginally as needed (irritation). , Disp: , Rfl:    tiZANidine (ZANAFLEX) 4 MG capsule, Take 4 mg by mouth at bedtime., Disp: , Rfl:    topiramate (TOPAMAX) 100 MG tablet, Take 100 mg by mouth 2 (two) times daily., Disp: , Rfl:    topiramate (TOPAMAX) 50 MG tablet, Take 50 mg by mouth 2 (two) times daily., Disp: , Rfl:    traZODone (DESYREL) 100 MG tablet, Take 300 mg by mouth at bedtime., Disp: , Rfl:    TRESIBA FLEXTOUCH 200 UNIT/ML FlexTouch Pen, Inject 109 Units into the skin daily., Disp: , Rfl:    TUBERCULIN SYR 1CC/26GX3/8" 26G X 3/8" 1 ML MISC, Use once weekly for subcutaneous methotrexate injection, Disp: 25 each, Rfl: 0  Vitals   Vitals:   September 16, 2023 1825 09/16/2023 1827  BP:  (!) 144/76  Pulse:  83  Resp:  18  SpO2:  99%  Height: 5\' 3"  (1.6 m)     Body mass index is 23.67 kg/m.  Physical Exam   Constitutional: Appears well-developed and well-nourished.  Neurologic Examination    Neuro: Mental Status: Patient is awake, alert,  oriented to person, place, month, year, and situation. Patient is able to give a clear and coherent history. No signs of aphasia or neglect Cranial Nerves: II: Visual Fields are full. Pupils are equal, round, and reactive to light.   III,IV, VI: EOMI without ptosis or diploplia.  V: Facial sensation is symmetric to temperature VII: Facial movement is symmetric.  VIII: hearing is intact to voice X: Uvula elevates symmetrically XII: tongue is midline without atrophy or fasciculations.  Motor: Tone is normal. Bulk is normal. 5/5 strength was present on the left, on the right she has downward drift without pronation in the right arm, she has 4/5 weakness of the right arm and leg. Sensory: Sensation is diminished on the right Cerebellar: She has discoordination consistent with weakness on the right       Labs/Imaging/Neurodiagnostic studies   CBC:  Recent Labs  Lab 09/16/23  1845 08/22/23 1856  WBC 4.2  --   NEUTROABS 2.0  --   HGB 13.2 13.9  HCT 43.2 41.0  MCV 80.6  --   PLT 302  --    Basic Metabolic Panel:  Lab Results  Component Value Date   NA 143 08/22/2023   K 3.6 08/22/2023   CO2 20 (L) 11/07/2021   GLUCOSE 213 (H) 08/22/2023   BUN 7 (L) 08/22/2023   CREATININE 0.80 08/22/2023   CALCIUM 9.1 11/07/2021   GFRNONAA >60 11/07/2021   GFRAA >60 12/06/2019   Lipid Panel: No results found for: "LDLCALC" HgbA1c: No results found for: "HGBA1C" Urine Drug Screen: No results found for: "LABOPIA", "COCAINSCRNUR", "LABBENZ", "AMPHETMU", "THCU", "LABBARB"  Alcohol Level No results found for: "ETH" INR  Lab Results  Component Value Date   INR 1.0 07/07/2020   APTT  Lab Results  Component Value Date   APTT 32 07/07/2020    CT Head without contrast(Personally reviewed): Negative  ASSESSMENT   Greer Wainright is a 63 y.o. female with syncopal episode followed by right-sided weakness that is improving.  My suspicion is that she very possibly could have had a focal  seizure, though this is not definite.  She has a history of several episodes (per patient report).  She did have an area of restricted diffusion in the left frontoparietal region near the motor strip following a cardiac cath with repeat imaging 4 days later showing resolution of the restricted diffusion.  She has seen Dr. Karel Jarvis in the past with concern for seizures.  I think seizures are certainly a possibility, and with her not having any for a couple of years, with recurrence in the setting of missing a few doses of gabapentin, I would favor restarting her gabapentin at 300 3 times daily as this appeared to be controlling her seizures.  RECOMMENDATIONS  If her right-sided weakness remains, would consider MRI of her brain If she reports her deficit is improved to baseline, then I would discharge with gabapentin 300 3 times daily and think we could forego the MRI.  _____________________________________________________________________   Signed, Ritta Slot, MD [facility]

## 2023-08-22 NOTE — Care Management (Signed)
 Received call from [facility] requesting assistance with transportation post discharge. Mitzi Davenport reports that patient is declining services unless she knows [facility] will provide a taxi voucher to get her home at discharge. This RNCM spoke with RN to advise to call [employer] [phone] and provide patient's address on file ([address]  [place] [id number]) then provide the dollar amount for taxi on voucher.   TOC supervisor approved transport/taxi voucher. Will need patient to sign a [facility] rider waiver & release of liability for prior to dc. This RNCM is covering virtually, unable to obtain signature. Notified oncoming RNCM.     No additional TOC needs at this time.

## 2023-08-22 NOTE — ED Triage Notes (Signed)
 Patient found unresponsive in lawn near bank for approx 10 minutes. Patient alert and oriented on arrival. Patient refusing IV and blood draw upon arrival as well. Patient more concerned with getting home then continuing care for stroke work up

## 2023-08-22 NOTE — ED Notes (Signed)
 Patient still refusing lab draw and IV. States she wants to go home

## 2023-08-22 NOTE — ED Notes (Signed)
 Patient transported to MRI

## 2024-03-31 ENCOUNTER — Other Ambulatory Visit: Payer: Self-pay

## 2024-03-31 ENCOUNTER — Emergency Department (HOSPITAL_COMMUNITY)

## 2024-03-31 ENCOUNTER — Emergency Department (HOSPITAL_COMMUNITY)
Admission: EM | Admit: 2024-03-31 | Discharge: 2024-03-31 | Disposition: A | Discharge status: Home / Self Care | Attending: Emergency Medicine | Admitting: Emergency Medicine

## 2024-03-31 DIAGNOSIS — E1142 Type 2 diabetes mellitus with diabetic polyneuropathy: Secondary | ICD-10-CM | POA: Diagnosis not present

## 2024-03-31 DIAGNOSIS — Z7951 Long term (current) use of inhaled steroids: Secondary | ICD-10-CM | POA: Diagnosis not present

## 2024-03-31 DIAGNOSIS — R519 Headache, unspecified: Secondary | ICD-10-CM | POA: Diagnosis not present

## 2024-03-31 DIAGNOSIS — G43109 Migraine with aura, not intractable, without status migrainosus: Secondary | ICD-10-CM

## 2024-03-31 DIAGNOSIS — I2510 Atherosclerotic heart disease of native coronary artery without angina pectoris: Secondary | ICD-10-CM | POA: Diagnosis not present

## 2024-03-31 DIAGNOSIS — F444 Conversion disorder with motor symptom or deficit: Secondary | ICD-10-CM | POA: Diagnosis not present

## 2024-03-31 DIAGNOSIS — R0789 Other chest pain: Secondary | ICD-10-CM | POA: Diagnosis not present

## 2024-03-31 DIAGNOSIS — J45909 Unspecified asthma, uncomplicated: Secondary | ICD-10-CM | POA: Diagnosis not present

## 2024-03-31 DIAGNOSIS — R479 Unspecified speech disturbances: Secondary | ICD-10-CM | POA: Diagnosis not present

## 2024-03-31 DIAGNOSIS — R29818 Other symptoms and signs involving the nervous system: Secondary | ICD-10-CM | POA: Insufficient documentation

## 2024-03-31 DIAGNOSIS — Z794 Long term (current) use of insulin: Secondary | ICD-10-CM | POA: Diagnosis not present

## 2024-03-31 DIAGNOSIS — Z79899 Other long term (current) drug therapy: Secondary | ICD-10-CM | POA: Diagnosis not present

## 2024-03-31 DIAGNOSIS — E039 Hypothyroidism, unspecified: Secondary | ICD-10-CM | POA: Diagnosis not present

## 2024-03-31 DIAGNOSIS — R531 Weakness: Secondary | ICD-10-CM | POA: Diagnosis not present

## 2024-03-31 DIAGNOSIS — R079 Chest pain, unspecified: Secondary | ICD-10-CM | POA: Diagnosis present

## 2024-03-31 LAB — CBC
HCT: 37.8 % (ref 36.0–46.0)
Hemoglobin: 12.2 g/dL (ref 12.0–15.0)
MCH: 26.9 pg (ref 26.0–34.0)
MCHC: 32.3 g/dL (ref 30.0–36.0)
MCV: 83.3 fL (ref 80.0–100.0)
Platelets: 274 K/uL (ref 150–400)
RBC: 4.54 MIL/uL (ref 3.87–5.11)
RDW: 12.9 % (ref 11.5–15.5)
WBC: 4.6 K/uL (ref 4.0–10.5)
nRBC: 0 % (ref 0.0–0.2)

## 2024-03-31 LAB — COMPREHENSIVE METABOLIC PANEL WITH GFR
ALT: 8 U/L (ref 0–44)
AST: 26 U/L (ref 15–41)
Albumin: 3.7 g/dL (ref 3.5–5.0)
Alkaline Phosphatase: 65 U/L (ref 38–126)
Anion gap: 7 (ref 5–15)
BUN: 14 mg/dL (ref 8–23)
CO2: 23 mmol/L (ref 22–32)
Calcium: 8.9 mg/dL (ref 8.9–10.3)
Chloride: 108 mmol/L (ref 98–111)
Creatinine, Ser: 0.75 mg/dL (ref 0.44–1.00)
GFR, Estimated: >60 mL/min (ref >60)
Glucose, Bld: 123 mg/dL — ABNORMAL HIGH (ref 70–99)
Potassium: 4.4 mmol/L (ref 3.5–5.1)
Sodium: 138 mmol/L (ref 135–145)
Total Bilirubin: 1.1 mg/dL (ref 0.0–1.2)
Total Protein: 6.5 g/dL (ref 6.5–8.1)

## 2024-03-31 LAB — RAPID URINE DRUG SCREEN, HOSP PERFORMED
Amphetamines: NOT DETECTED
Barbiturates: NOT DETECTED
Benzodiazepines: NOT DETECTED
Cocaine: NOT DETECTED
Opiates: NOT DETECTED
Tetrahydrocannabinol: POSITIVE — AB

## 2024-03-31 LAB — APTT: aPTT: 33 s (ref 24–36)

## 2024-03-31 LAB — CBG MONITORING, ED: Glucose-Capillary: 109 mg/dL — ABNORMAL HIGH (ref 70–99)

## 2024-03-31 LAB — I-STAT CHEM 8, ED
BUN: 15 mg/dL (ref 8–23)
Calcium, Ion: 1.08 mmol/L — ABNORMAL LOW (ref 1.15–1.40)
Chloride: 107 mmol/L (ref 98–111)
Creatinine, Ser: 0.8 mg/dL (ref 0.44–1.00)
Glucose, Bld: 118 mg/dL — ABNORMAL HIGH (ref 70–99)
HCT: 36 % (ref 36.0–46.0)
Hemoglobin: 12.2 g/dL (ref 12.0–15.0)
Potassium: 4 mmol/L (ref 3.5–5.1)
Sodium: 140 mmol/L (ref 135–145)
TCO2: 23 mmol/L (ref 22–32)

## 2024-03-31 LAB — DIFFERENTIAL
Abs Immature Granulocytes: 0.01 K/uL (ref 0.00–0.07)
Basophils Absolute: 0 K/uL (ref 0.0–0.1)
Basophils Relative: 0 %
Eosinophils Absolute: 0 K/uL (ref 0.0–0.5)
Eosinophils Relative: 1 %
Immature Granulocytes: 0 %
Lymphocytes Relative: 43 %
Lymphs Abs: 2 K/uL (ref 0.7–4.0)
Monocytes Absolute: 0.3 K/uL (ref 0.1–1.0)
Monocytes Relative: 7 %
Neutro Abs: 2.3 K/uL (ref 1.7–7.7)
Neutrophils Relative %: 49 %

## 2024-03-31 LAB — PROTIME-INR
INR: 1 (ref 0.8–1.2)
Prothrombin Time: 13.2 s (ref 11.4–15.2)

## 2024-03-31 LAB — TROPONIN I (HIGH SENSITIVITY): Troponin I (High Sensitivity): 3 ng/L (ref <18)

## 2024-03-31 LAB — ETHANOL: Alcohol, Ethyl (B): <15 mg/dL (ref <15)

## 2024-03-31 MED ORDER — SODIUM CHLORIDE 0.9 % IV BOLUS
1000.0000 mL | Freq: Once | INTRAVENOUS | Status: AC
  Administered 2024-03-31: 1000 mL via INTRAVENOUS

## 2024-03-31 MED ORDER — PROCHLORPERAZINE EDISYLATE 10 MG/2ML IJ SOLN
10.0000 mg | Freq: Once | INTRAMUSCULAR | Status: AC
  Administered 2024-03-31: 10 mg via INTRAVENOUS
  Filled 2024-03-31: qty 2

## 2024-03-31 MED ORDER — DIPHENHYDRAMINE HCL 50 MG/ML IJ SOLN
50.0000 mg | Freq: Once | INTRAMUSCULAR | Status: AC
  Administered 2024-03-31: 50 mg via INTRAVENOUS
  Filled 2024-03-31: qty 1

## 2024-03-31 MED ORDER — SODIUM CHLORIDE 0.9% FLUSH
3.0000 mL | Freq: Once | INTRAVENOUS | Status: AC
  Administered 2024-03-31: 3 mL via INTRAVENOUS

## 2024-03-31 MED ORDER — LORAZEPAM 2 MG/ML IJ SOLN
INTRAMUSCULAR | Status: AC
  Filled 2024-03-31: qty 1

## 2024-03-31 NOTE — ED Notes (Signed)
 [facility] CALLED

## 2024-03-31 NOTE — ED Triage Notes (Addendum)
 Pt to ED via [facility] from home c/o left sided weakness, left sided facial droop, slurred speech, and chest pain. Pt alert and oriented x 4. Ambulatory on scene. Pt reports previous stroke with right sided deficits . Pt able to move right side with no problem with EMS. BS: 122 mg/dl No blood thinners. LKN 1730

## 2024-03-31 NOTE — Consult Note (Signed)
 NEUROLOGY CONSULT NOTE   Date of service: March 31, 2024 Patient Name: Jamie Holmes MRN:  [id number] DOB:  11-03-1960 Chief Complaint: Left sided weakness Requesting Provider: Tegeler, Lonni PARAS, *  History of Present Illness  Jamie Holmes is a 63 y.o. female with hx of fibromyalgia, prior stroke with minimal right-sided deficits, anxiety, hypothyroidism, avascular necrosis, diabetes, memory difficulties, hyperlipidemia, migraines, SLE, who presents for evaluation of sudden onset of chest pain, left-sided weakness and multiple other complaints.  Code stroke was activated due to left-sided weakness She describes that her symptoms started with left-sided chest pain around 1730 hrs. She is only taking her home bupropion. No new medications.  No medication changes.  Reports chest pain and headache.  No other complaints.  Per EMS, recent break-up with boyfriend and possible worsening depression per verbal report from neighbors.   LKW: 1730 Modified rankin score: 0-Completely asymptomatic and back to baseline post- stroke IV Thrombolysis: No-MRI negative EVT: No-MRI negative  NIHSS components Score: Comment  1a Level of Conscious 0[]  1[x]  2[]  3[]      1b LOC Questions 0[x]  1[]  2[]       1c LOC Commands 0[x]  1[]  2[]       2 Best Gaze 0[x]  1[]  2[]       3 Visual 0[x]  1[]  2[]  3[]      4 Facial Palsy 0[x]  1[]  2[]  3[]      5a Motor Arm - left 0[x]  1[]  2[]  3[]  4[]  UN[]    5b Motor Arm - Right 0[x]  1[]  2[]  3[]  4[]  UN[]    6a Motor Leg - Left 0[x]  1[]  2[]  3[]  4[]  UN[]    6b Motor Leg - Right 0[x]  1[]  2[]  3[]  4[]  UN[]    7 Limb Ataxia 0[x]  1[]  2[]  UN[]      8 Sensory 0[]  1[]  2[x]  UN[]      9 Best Language 0[x]  1[]  2[]  3[]      10 Dysarthria 0[]  1[]  2[x]  UN[]      11 Extinct. and Inattention 0[x]  1[]  2[]       TOTAL: 4      ROS  Tensive ROS performed.  Pertinent positives in the HPI.  Past History   Past Medical History:  Diagnosis Date   Abnormality of gait 07/26/2015   Acquired  hypothyroidism 11/23/2012   Last Assessment & Plan:   Formatting of this note might be different from the original.  Onset after prednisone started for SLE, she reports prednisone allergy now.  Continue current dose levothyroxine  Check labs   Acquired unequal leg length 11/23/2012   Anxiety 06/19/2010   Arthropathy of lumbar facet joint 02/03/2021   Formatting of this note might be different from the original. Added automatically from request for surgery [id number]   Asthma 08/21/2011   Atherosclerotic heart disease of native coronary artery without angina pectoris 08/26/2016   Formatting of this note might be different from the original.  Added automatically from request for surgery [phone]   Avascular necrosis (HCC)    Bilateral knee pain 12/08/2021   Chest pain, unspecified 01/21/2017   Formatting of this note might be different from the original.  Added automatically from request for surgery [phone]   Chronic liver disease 11/23/2012   Last Assessment & Plan:   Formatting of this note might be different from the original.  Patient reports history of non-alcoholic liver diseases  Asks for HCV test, has seen the commercial   Constipation 08/11/2013   Diabetes mellitus without complication (HCC)    Diabetic polyneuropathy associated with type 2 diabetes mellitus (HCC) 03/22/2017  Fibromyalgia    GERD (gastroesophageal reflux disease) 07/16/2020   Hemiplegia affecting dominant side (HCC) 08/21/2011   High risk medication use 01/03/2021   Insomnia 11/23/2012   Interstitial cystitis    Memory difficulty 07/26/2015   Migraines 06/20/1979   Mixed hyperlipidemia 04/12/2018   Formatting of this note might be different from the original.  Added automatically from request for surgery [id number]   Neck pain 03/19/2013   Osteoarthritis    Osteoporosis 07/16/2020   Pain in left ankle and joints of left foot 12/01/2020   Raynaud's disease 06/19/1993   Right lower quadrant abdominal pain 08/02/2020    Sjogren's disease    Stroke (HCC) 07/27/2017   Superficial venous thrombosis of arm 07/05/2021   Systemic lupus erythematosus, unspecified (HCC) 11/23/2012   Thyroid disease    Tremor 07/26/2015   Type 2 diabetes mellitus with diabetic polyneuropathy, with long-term current use of insulin  (HCC) 11/23/2012   Last Assessment & Plan:   Formatting of this note might be different from the original.  Poorly controlled but sounds like her readings are getting better since adding insulin  and Invokana and stopping 9 can/day soft drinks.  Check A1c with further adjustments pending results  The goal is for the Hgb A1C to be less than 7.0.  It is recommended that all diabetics follow a healthy diabetic diet, exe    Past Surgical History:  Procedure Laterality Date   ABDOMINAL HYSTERECTOMY     BILATERAL OOPHORECTOMY     bladder tack     BREAST SURGERY     lump, right breast   heart stent  2018   NOSE SURGERY      Family History: Family History  Problem Relation Age of Onset   Stroke Mother    Hypothyroidism Mother    High Cholesterol Mother    Heart disease Mother    Heart disease Father    Stroke Father    Heart attack Father    Arthritis Father    Diabetes Father    Diabetes Brother    Hyperthyroidism Brother    Heart disease Brother    Breast cancer Maternal Aunt        3 mat aunts with breast cancer   Heart disease Maternal Grandmother    Cancer Maternal Grandmother    Arthritis Maternal Grandmother    Diabetes Maternal Grandmother    Heart disease Maternal Grandfather    Cancer Maternal Grandfather    Arthritis Maternal Grandfather    Heart disease Paternal Grandmother    Arthritis Paternal Grandmother    Stroke Paternal Grandmother    Heart attack Paternal Grandmother    Diabetes Paternal Grandmother    Heart disease Paternal Grandfather    Arthritis Paternal Grandfather    Heart attack Paternal Grandfather    Diabetes Paternal Grandfather     Social History  reports  that she has never smoked. She has been exposed to tobacco smoke. She has never used smokeless tobacco. She reports that she does not drink alcohol and does not use drugs.    Medications  No current facility-administered medications for this encounter.  Current Outpatient Medications:    Albuterol Sulfate, sensor, (PROAIR DIGIHALER) 108 (90 Base) MCG/ACT AEPB, Inhale into the lungs., Disp: , Rfl:    azelastine (ASTELIN) 0.1 % nasal spray, Place into both nostrils as needed., Disp: , Rfl:    busPIRone (BUSPAR) 5 MG tablet, Take 5 mg by mouth as needed (anxiety)., Disp: , Rfl:    clopidogrel (PLAVIX) 75  MG tablet, Take 75 mg by mouth daily., Disp: , Rfl: 3   diclofenac Sodium (VOLTAREN) 1 % GEL, Apply 4 g topically as needed (pain)., Disp: , Rfl:    DULoxetine (CYMBALTA) 60 MG capsule, Take 60 mg by mouth daily., Disp: , Rfl:    EPINEPHrine (ADRENALIN) 1 MG/ML SOLN injection, Inject 1 mg into the skin as needed for anaphylaxis., Disp: , Rfl:    escitalopram (LEXAPRO) 20 MG tablet, Take 20 mg by mouth daily. , Disp: , Rfl:    famotidine (PEPCID) 40 MG tablet, Take 40 mg by mouth daily., Disp: , Rfl:    fluticasone (FLONASE) 50 MCG/ACT nasal spray, Place 1 spray into both nostrils every 12 (twelve) hours as needed for allergies or rhinitis., Disp: , Rfl:    gabapentin  (NEURONTIN ) 800 MG tablet, Take 800 mg by mouth 3 (three) times daily., Disp: , Rfl:    levothyroxine (SYNTHROID) 137 MCG tablet, Take 137 mcg by mouth daily., Disp: , Rfl:    lidocaine (LIDODERM) 5 %, 1 patch daily., Disp: , Rfl:    LINZESS 145 MCG CAPS capsule, Take 290 mcg by mouth daily., Disp: , Rfl:    naloxone (NARCAN) nasal spray 4 mg/0.1 mL, Place 1 spray into the nose as needed (opoid reversal)., Disp: , Rfl:    nitroGLYCERIN (NITROSTAT) 0.4 MG SL tablet, PLACE 1 TAB UNDER TONGUE EVERY 5 MINS AS NEEDED FOR CHEST PAIN.CALL 911 IF TAKE 2 DOSE*MAX 3/DAY, Disp: , Rfl:    OZEMPIC, 1 MG/DOSE, 2 MG/1.5ML SOPN, Inject 2 mg as  directed once a week., Disp: , Rfl:    pantoprazole (PROTONIX) 40 MG tablet, Take 40 mg by mouth 2 (two) times daily., Disp: , Rfl:    pramipexole (MIRAPEX) 0.75 MG tablet, Take 0.75 mg by mouth at bedtime., Disp: , Rfl:    promethazine (PHENERGAN) 25 MG tablet, Take 25 mg by mouth every 8 (eight) hours as needed for vomiting or nausea., Disp: , Rfl:    tiZANidine (ZANAFLEX) 4 MG capsule, Take 4 mg by mouth at bedtime., Disp: , Rfl:    topiramate (TOPAMAX) 100 MG tablet, Take 100 mg by mouth 2 (two) times daily., Disp: , Rfl:    topiramate (TOPAMAX) 50 MG tablet, Take 50 mg by mouth 2 (two) times daily., Disp: , Rfl:    traZODone (DESYREL) 100 MG tablet, Take 300 mg by mouth at bedtime., Disp: , Rfl:    TRESIBA FLEXTOUCH 200 UNIT/ML FlexTouch Pen, Inject 120 Units into the skin daily at 10 pm., Disp: , Rfl:   Vitals   Vitals:   03/31/24 2021 03/31/24 2102 03/31/24 2115 03/31/24 2200  BP:  136/76 (!) 144/76   Pulse:  82 81   Resp:  20 14   Temp:    98.6 F (37 C)  TempSrc:    Oral  SpO2:  93% 96%   Weight: 58.8 kg       Body mass index is 22.96 kg/m.   Physical Exam   General: Somewhat drowsy HEENT: Normocephalic atraumatic Lungs: Clear Neurologic exam Drowsy.  Opens eyes to voice.  Responds to commands Dysarthric speech No evidence of aphasia Cranial nerves II to XII intact Motor examination with some effort dependent weakness but on coaching able to give significant strength that has no drift in all 4 extremities although she has some undulating movements of her left upper and lower extremity. Sensory exam: Diminished sensation on the left hemibody with a sharp cut off in the center Coordination: Grossly  normal   Labs/Imaging/Neurodiagnostic studies   CBC:  Recent Labs  Lab 2024-04-26 2021 04/26/24 2022  WBC  --  4.6  NEUTROABS  --  2.3  HGB 12.2 12.2  HCT 36.0 37.8  MCV  --  83.3  PLT  --  274   Basic Metabolic Panel:  Lab Results  Component Value Date   NA  138 Apr 26, 2024   K 4.4 04/26/2024   CO2 23 2024/04/26   GLUCOSE 123 (H) 04-26-2024   BUN 14 2024-04-26   CREATININE 0.75 2024/04/26   CALCIUM 8.9 2024/04/26   GFRNONAA >60 04/26/2024   GFRAA >60 12/06/2019   Urine Drug Screen:     Component Value Date/Time   LABOPIA NONE DETECTED 08/22/2023 1930   COCAINSCRNUR NONE DETECTED 08/22/2023 1930   LABBENZ NONE DETECTED 08/22/2023 1930   AMPHETMU POSITIVE (A) 08/22/2023 1930   THCU POSITIVE (A) 08/22/2023 1930   LABBARB NONE DETECTED 08/22/2023 1930    Alcohol Level     Component Value Date/Time   [facility] <15 2024-04-26 2022   INR  Lab Results  Component Value Date   INR 1.0 2024/04/26   APTT  Lab Results  Component Value Date   APTT 33 Apr 26, 2024    Imaging personally reviewed CT head code stroke-no acute abnormality.  Aspects 10.  No bleed MRI brain done stat-no evidence of acute stroke  ASSESSMENT   Jamie Holmes is a 63 y.o. female with above past medical history presenting for evaluation of chest pain, left-sided weakness and slurred speech.  Initial call out to EMS was for chest pain but when they arrived, they also noticed left-sided weakness and a code stroke was activated. Exam has some functional features. Given her prior history of stroke periprocedurally during a cath and also of multiple other risk factors, she was taken for stat MRI after CT head was unremarkable for acute process MRI of the brain did not reveal any evidence of acute stroke. At this time, I suspect, given her headaches, this could either be complex migraine versus functional neurological disorder with weakness.   RECOMMENDATIONS  I would recommend checking labs to ensure there is no underlying infection that might be causing the symptoms although I have low suspicion for that. I would recommend treatment with migraine cocktail and reevaluate. If symptoms are improved, she can be discharged home She may need ongoing psychiatric follow-up as  there is some psychosocial factors that may be playing a role in her current presentation as well   Inpatient neurology will be available as needed Plan discussed with Dr. Ginger.  ______________________________________________________________________    Bonney Eligio Lav, MD [facility]\

## 2024-03-31 NOTE — ED Notes (Signed)
 Pt to CT with RN

## 2024-03-31 NOTE — ED Notes (Signed)
 Pt says she has been feeling suicidal denies any specific plan or method. Denies homicial thoughts. Charge made and Dr. Ginger made aware.

## 2024-03-31 NOTE — ED Provider Notes (Signed)
 [place] EMERGENCY DEPARTMENT AT [facility] Provider Note   CSN: [id number] Arrival date & time: 03/31/24  2012     Patient presents with: No chief complaint on file.   Jamie Holmes is a 63 y.o. female.   The history is provided by the patient and medical records. No language interpreter was used.  Neurologic Problem This is a new problem. The current episode started 1 to 2 hours ago. The problem has been gradually improving. Associated symptoms include chest pain and headaches. Pertinent negatives include no abdominal pain and no shortness of breath. Nothing (bright l;ight headache) aggravates the symptoms. Nothing relieves the symptoms. She has tried nothing for the symptoms. The treatment provided no relief.       Prior to Admission medications   Medication Sig Start Date End Date Taking? Authorizing Provider  Albuterol Sulfate, sensor, (PROAIR DIGIHALER) 108 (90 Base) MCG/ACT AEPB Inhale into the lungs. 04/13/21   [provider]  azelastine (ASTELIN) 0.1 % nasal spray Place into both nostrils as needed. 07/07/20   [provider]  busPIRone (BUSPAR) 5 MG tablet Take 5 mg by mouth as needed (anxiety). 07/30/17   [provider]  clopidogrel (PLAVIX) 75 MG tablet Take 75 mg by mouth daily. 08/05/17   [provider]  diclofenac Sodium (VOLTAREN) 1 % GEL Apply 4 g topically as needed (pain). 12/03/20   [provider]  DULoxetine (CYMBALTA) 60 MG capsule Take 60 mg by mouth daily. 11/18/19   [provider]  EPINEPHrine (ADRENALIN) 1 MG/ML SOLN injection Inject 1 mg into the skin as needed for anaphylaxis.    [provider]  escitalopram (LEXAPRO) 20 MG tablet Take 20 mg by mouth daily.     [provider]  famotidine (PEPCID) 40 MG tablet Take 40 mg by mouth daily.    [provider]  fluticasone (FLONASE) 50 MCG/ACT nasal spray Place 1 spray into both nostrils every 12 (twelve) hours as  needed for allergies or rhinitis.    [provider]  gabapentin  (NEURONTIN ) 800 MG tablet Take 800 mg by mouth 3 (three) times daily.    [provider]  levothyroxine (SYNTHROID) 137 MCG tablet Take 137 mcg by mouth daily. 12/06/20   [provider]  lidocaine (LIDODERM) 5 % 1 patch daily. 12/05/20   [provider]  LINZESS 145 MCG CAPS capsule Take 290 mcg by mouth daily. 04/08/20   [provider]  naloxone [facility]) nasal spray 4 mg/0.1 mL Place 1 spray into the nose as needed (opoid reversal).    [provider]  nitroGLYCERIN (NITROSTAT) 0.4 MG SL tablet PLACE 1 TAB UNDER TONGUE EVERY 5 MINS AS NEEDED FOR CHEST PAIN.CALL 911 IF TAKE 2 DOSE*MAX 3/DAY 07/12/20   [provider]  OZEMPIC, 1 MG/DOSE, 2 MG/1.5ML SOPN Inject 2 mg as directed once a week. 11/24/19   [provider]  pantoprazole (PROTONIX) 40 MG tablet Take 40 mg by mouth 2 (two) times daily.    [provider]  pramipexole (MIRAPEX) 0.75 MG tablet Take 0.75 mg by mouth at bedtime.    [provider]  promethazine (PHENERGAN) 25 MG tablet Take 25 mg by mouth every 8 (eight) hours as needed for vomiting or nausea.    [provider]  tiZANidine (ZANAFLEX) 4 MG capsule Take 4 mg by mouth at bedtime.    [provider]  topiramate (TOPAMAX) 100 MG tablet Take 100 mg by mouth 2 (two) times daily.  [provider]  topiramate (TOPAMAX) 50 MG tablet Take 50 mg by mouth 2 (two) times daily. 11/20/19   [provider]  traZODone (DESYREL) 100 MG tablet Take 300 mg by mouth at bedtime.    [provider]  TRESIBA FLEXTOUCH 200 UNIT/ML FlexTouch Pen Inject 120 Units into the skin daily at 10 pm. 06/07/20   [provider]    Allergies: Atorvastatin, Ivp dye [iodinated contrast media], Other, Penicillins, Prednisone, and Statins    Review of Systems  Constitutional:  Negative for chills, fatigue and  fever.  HENT:  Negative for congestion.   Eyes:  Negative for visual disturbance.  Respiratory:  Negative for cough, chest tightness, shortness of breath and wheezing.   Cardiovascular:  Positive for chest pain. Negative for palpitations.  Gastrointestinal:  Negative for abdominal pain, constipation, diarrhea and vomiting.  Genitourinary:  Negative for dysuria.  Musculoskeletal:  Negative for back pain, neck pain and neck stiffness.  Skin:  Negative for rash and wound.  Neurological:  Positive for speech difficulty, weakness and headaches. Negative for dizziness, light-headedness and numbness.  Psychiatric/Behavioral:  Negative for agitation and confusion.     Updated Vital Signs Wt 58.8 kg   BMI 22.96 kg/m   Physical Exam Vitals and nursing note reviewed.  Constitutional:      General: She is not in acute distress.    Appearance: She is well-developed. She is not ill-appearing, toxic-appearing or diaphoretic.  HENT:     Head: Normocephalic and atraumatic.  Eyes:     Conjunctiva/sclera: Conjunctivae normal.  Cardiovascular:     Rate and Rhythm: Normal rate and regular rhythm.     Heart sounds: Normal heart sounds. No murmur heard. Pulmonary:     Effort: Pulmonary effort is normal. No respiratory distress.     Breath sounds: Normal breath sounds. No wheezing, rhonchi or rales.  Chest:     Chest wall: No tenderness.  Abdominal:     Palpations: Abdomen is soft.     Tenderness: There is no abdominal tenderness.  Musculoskeletal:        General: No swelling.     Cervical back: Neck supple.  Skin:    General: Skin is warm and dry.     Capillary Refill: Capillary refill takes less than 2 seconds.     Findings: No erythema.  Neurological:     General: No focal deficit present.     Mental Status: She is alert.  Psychiatric:        Mood and Affect: Mood normal.     (all labs ordered are listed, but only abnormal results are displayed) Labs Reviewed  COMPREHENSIVE  METABOLIC PANEL WITH GFR - Abnormal; Notable for the following components:      Result Value   Glucose, Bld 123 (*)    All other components within normal limits  I-STAT CHEM 8, ED - Abnormal; Notable for the following components:   Glucose, Bld 118 (*)    Calcium, Ion 1.08 (*)    All other components within normal limits  CBG MONITORING, ED - Abnormal; Notable for the following components:   Glucose-Capillary 109 (*)    All other components within normal limits  PROTIME-INR  APTT  CBC  DIFFERENTIAL  ETHANOL  RAPID URINE DRUG SCREEN, HOSP PERFORMED  TROPONIN I (HIGH SENSITIVITY)  TROPONIN I (HIGH SENSITIVITY)    EKG: EKG Interpretation Date/Time:  Monday March 31 2024 21:02:59 EDT Ventricular Rate:  81 PR Interval:  295 QRS Duration:  97  QT Interval:  382 QTC Calculation: 444 R Axis:   55  Text Interpretation: Sinus rhythm Prolonged PR interval Consider left atrial enlargement when compared to prior, similar appearance No STEMI Confirmed by Ginger Barefoot ([id number]) on 03/31/2024 9:04:06 PM  Radiology: MR BRAIN WO CONTRAST Result Date: 03/31/2024 EXAM: MR Brain without Intravenous Contrast. CLINICAL HISTORY: Neuro deficit, acute, stroke suspected. TECHNIQUE: Magnetic resonance images of the brain without intravenous contrast in multiple planes. CONTRAST: Without. COMPARISON: None provided. FINDINGS: BRAIN: Scattered subcentimeter foci of T2 FLAIR hypertrophy are seen involving the typical white matter, nonspecific, but overall minimal and nature, and less than is the same for age. No restricted diffusion to indicate acute infarction. No intracranial mass or hemorrhage. No midline shift or extra-axial fluid collection. No cerebellar tonsillar ectopia. The central arterial and venous flow voids are patent. VENTRICLES: No hydrocephalus. ORBITS: Prior bilateral ocular lens replacement. SINUSES AND MASTOIDS: The sinuses and mastoid air cells are clear. BONES: No acute fracture or focal  osseous lesion. IMPRESSION: 1. Normal brain MRI for age. No acute intracranial abnormality. Electronically signed by: Morene Hoard MD 03/31/2024 10:21 PM EDT RP Workstation: [id number]   DG Chest Portable 1 View Result Date: 03/31/2024 EXAM: 1 VIEW XRAY OF THE CHEST 03/31/2024 09:42:00 PM COMPARISON: Chest x-ray 10/18/2023. CLINICAL HISTORY: Chest pain. Pt to ED via [facility] from home c/o left sided weakness, left sided facial droop, slurred speech, and chest pain. Pt alert and oriented x 4. Ambulatory on scene. Pt reports previous stroke with right sided deficits. Pt able to move right side with no problem with EMS. BS: 122 mg/dl. No blood thinners. LKN 1730. FINDINGS: LUNGS AND PLEURA: No focal pulmonary opacity. No pulmonary edema. No pleural effusion. No pneumothorax. HEART AND MEDIASTINUM: No acute abnormality of the cardiac and mediastinal silhouettes. BONES AND SOFT TISSUES: No acute osseous abnormality. IMPRESSION: 1. No acute cardiopulmonary disease. Electronically signed by: Greig Pique MD 03/31/2024 09:49 PM EDT RP Workstation: [id number]   CT HEAD CODE STROKE WO CONTRAST Result Date: 03/31/2024 EXAM: CT HEAD WITHOUT CONTRAST 03/31/2024 08:22:02 PM TECHNIQUE: CT of the head was performed without the administration of intravenous contrast. Automated exposure control, iterative reconstruction, and/or weight based adjustment of the mA/kV was utilized to reduce the radiation dose to as low as reasonably achievable. COMPARISON: None available. CLINICAL HISTORY: Neuro deficit, acute, stroke suspected. No contrast; CT HEAD CODE STROKE WO CONTRAST; Neuro deficit, acute, stroke suspected; Left sided weakness, slurred speech; [place] [id number] FINDINGS: BRAIN AND VENTRICLES: No acute hemorrhage. No evidence of acute infarct. No hydrocephalus. No extra-axial collection. No mass effect or midline shift. ORBITS: No acute abnormality. SINUSES: No acute abnormality. SOFT TISSUES AND SKULL: No acute soft tissue  abnormality. No skull fracture. IMPRESSION: 1. No acute intracranial abnormality. 2. Aspects is 10 3. Findings communicated by text page to Dr. Arora at 8:27 pm on 01/30/2024. Electronically signed by: Morene Hoard MD 03/31/2024 08:28 PM EDT RP Workstation: [id number]     Procedures   Medications Ordered in the ED  sodium chloride  flush (NS) 0.9 % injection 3 mL (3 mLs Intravenous Given 03/31/24 2153)  prochlorperazine (COMPAZINE) injection 10 mg (10 mg Intravenous Given 03/31/24 2153)  diphenhydrAMINE  (BENADRYL ) injection 50 mg (50 mg Intravenous Given 03/31/24 2153)  sodium chloride  0.9 % bolus 1,000 mL (1,000 mLs Intravenous New Bag/Given 03/31/24 2153)  Medical Decision Making Amount and/or Complexity of Data Reviewed Labs: ordered. Radiology: ordered.  Risk Prescription drug management.    Jamie Holmes is a 63 y.o. female with a past medical history significant for GERD, thyroid disease, previous stroke, Sjogren's, Raynaud's, asthma, hypothyroidism, hyperlipidemia, CAD with previous PCI, fibromyalgia, interstitial cystitis, lupus, and diabetes with polyneuropathy who presents as a code stroke for slurred speech and chest pain.  According to patient, at about 7 PM this evening she started having pain in her left chest and central chest.  She reports it wraps around towards her left arm and towards her back and then she started having some neurologic symptoms.  She reports she was having difficulty with her speech as well as some left-sided weakness in her arm and face.  Patient activated as a code stroke and was taken to CT scanner.  On my exam, I did not find focal neurologic deficits aside from some garbled speech when she arrived.  She did not seem to have focal unilateral weakness or facial droop on my initial exam.  Patient taken to CT scanner where she had CT followed by MRI that did not show clear evidence of acute stroke.  Neurology  feels it is more of a complex migraine given her headache and recommend treating as such with a headache cocktail.  This was ordered.  When I went to reassess the patient, she was telling about her cardiac history including atypical chest pain and stenting needed.  Patient otherwise denies diaphoresis, nausea, vomiting, or shortness of breath.  Patient tells me she would like to try to go home if possible however we will get cardiac enzymes and a chest x-ray as well to make sure is not a cardiac etiology.  Her EKG did not show evidence of STEMI initially.  Anticipate reassessment after headache cocktail and troponin and chest x-ray.  If they are reassuring, patient may be safe for discharge home and outpatient cardiology and PCP and neurology follow-up.  Anticipate reassessment.  Patient feeling better after headache cocktail.  MRI brain does not show stroke and her chest x-ray was reassuring.  Her troponin was negative.   Given the negative cardiac workup we feel she is safe for discharge home and patient agrees.  We suspect her speech and neurologic symptoms related to complex migraine as recommended by neurology.  As she is improved and is feeling better, will plan for discharge with outpatient cardiology, neurology, and PCP follow-up.  Patient does confirm to me that she is not suicidal.  We will plan for discharge for outpatient follow-up.      Final diagnoses:  Transient neurological symptoms  Atypical chest pain  Transient speech disturbance  Acute nonintractable headache, unspecified headache type   Clinical Impression: 1. Transient neurological symptoms   2. Atypical chest pain   3. Transient speech disturbance   4. Acute nonintractable headache, unspecified headache type     Disposition: Discharge  Condition: Good  I have discussed the results, Dx and Tx plan with the pt(& family if present). He/she/they expressed understanding and agree(s) with the plan. Discharge  instructions discussed at great length. Strict return precautions discussed and pt &/or family have verbalized understanding of the instructions. No further questions at time of discharge.    New Prescriptions   No medications on file    Follow Up: Your PCP, cardiologist, and neurology team.     [facility] [place] [address] [place]  [place] [address] [phone]    [facility] [address]     [address] [place]  [address] [phone]        Shadell Brenn, Lonni PARAS, MD 03/31/24 [phone]

## 2024-03-31 NOTE — ED Notes (Signed)
Pt able to walk to the restroom.

## 2024-03-31 NOTE — Code Documentation (Signed)
 Stroke Response Nurse Documentation Code Documentation  Jamie Holmes is a 63 y.o. female arriving to [facility]  via [facility] on 10/13 with past medical hx of  fibromyalgia, prior stroke with minimal right-sided deficits, anxiety, hypothyroidism, avascular necrosis, diabetes, memory difficulties, hyperlipidemia, migraines, SLE . On No antithrombotic. Code stroke was activated by EMS.   Patient from home where she was LKW at 1730 and now complaining of left sided weakness.   Stroke team at the bedside on patient arrival. Labs drawn and patient cleared for CT by Dr. Donne. Patient to CT with team. NIHSS 5, see documentation for details and code stroke times. Patient with decreased LOC, left decreased sensation, and dysarthria  on exam. The following imaging was completed:  CT Head and MRI. Patient is not a candidate for IV Thrombolytic due to neg MRI. Patient is not a candidate for IR due to MRI neg.   Dr. Arora ordered Ativan  2mg  IV for MRI. Ativan  2mg  IV was given at 2029.    Bedside handoff with ED RN Rain[place].    Griselda Alm ORN  Rapid Response RN

## 2024-03-31 NOTE — Discharge Instructions (Signed)
 Your history, exam, workup today did not show evidence of acute stroke today.  The neurology team felt your symptoms related to a complex migraine causing the transient neurologic symptoms in the context of your headache.  Your cardiac workup also did not show clear evidence of an acute cardiac cause of your discomfort and your heart enzyme was negative.  Your chest x-ray is reassuring as were the rest of your labs.  We agree with plan for discharge home as you are feeling better.  Please rest and stay hydrated and follow-up with your primary doctor, your cardiologist, and your neurology team.  If any symptoms change or worsen acutely, please return to the nearest emergency department.
